# Patient Record
Sex: Male | Born: 1970 | Race: Black or African American | Hispanic: No | Marital: Married | Smoking: Never smoker
Health system: Southern US, Community
[De-identification: ages and names within clinical notes are randomized; demographics above are authoritative.]

## PROBLEM LIST (undated history)

## (undated) DIAGNOSIS — R918 Other nonspecific abnormal finding of lung field: Secondary | ICD-10-CM

## (undated) DIAGNOSIS — K219 Gastro-esophageal reflux disease without esophagitis: Secondary | ICD-10-CM

## (undated) DIAGNOSIS — N529 Male erectile dysfunction, unspecified: Secondary | ICD-10-CM

## (undated) DIAGNOSIS — G4700 Insomnia, unspecified: Secondary | ICD-10-CM

## (undated) DIAGNOSIS — E119 Type 2 diabetes mellitus without complications: Secondary | ICD-10-CM

## (undated) DIAGNOSIS — K760 Fatty (change of) liver, not elsewhere classified: Secondary | ICD-10-CM

## (undated) DIAGNOSIS — I10 Essential (primary) hypertension: Secondary | ICD-10-CM

## (undated) HISTORY — DX: Male erectile dysfunction, unspecified: N52.9

## (undated) HISTORY — DX: Fatty (change of) liver, not elsewhere classified: K76.0

## (undated) HISTORY — DX: Other nonspecific abnormal finding of lung field: R91.8

## (undated) HISTORY — DX: Gastro-esophageal reflux disease without esophagitis: K21.9

## (undated) HISTORY — DX: Insomnia, unspecified: G47.00

---

## 1997-12-03 ENCOUNTER — Emergency Department (HOSPITAL_COMMUNITY): Admission: EM | Admit: 1997-12-03 | Discharge: 1997-12-03 | Payer: Self-pay | Admitting: Emergency Medicine

## 2005-02-08 ENCOUNTER — Emergency Department (HOSPITAL_COMMUNITY): Admission: EM | Admit: 2005-02-08 | Discharge: 2005-02-08 | Payer: Self-pay | Admitting: Emergency Medicine

## 2007-07-14 ENCOUNTER — Emergency Department (HOSPITAL_COMMUNITY): Admission: EM | Admit: 2007-07-14 | Discharge: 2007-07-14 | Payer: Self-pay | Admitting: Emergency Medicine

## 2007-09-26 ENCOUNTER — Emergency Department (HOSPITAL_COMMUNITY): Admission: EM | Admit: 2007-09-26 | Discharge: 2007-09-26 | Payer: Self-pay | Admitting: Emergency Medicine

## 2008-04-29 ENCOUNTER — Emergency Department (HOSPITAL_COMMUNITY): Admission: EM | Admit: 2008-04-29 | Discharge: 2008-04-29 | Payer: Self-pay | Admitting: Emergency Medicine

## 2008-05-01 ENCOUNTER — Emergency Department (HOSPITAL_COMMUNITY): Admission: EM | Admit: 2008-05-01 | Discharge: 2008-05-01 | Payer: Self-pay | Admitting: Emergency Medicine

## 2008-07-25 ENCOUNTER — Emergency Department (HOSPITAL_COMMUNITY): Admission: EM | Admit: 2008-07-25 | Discharge: 2008-07-25 | Payer: Self-pay | Admitting: Emergency Medicine

## 2008-10-10 ENCOUNTER — Emergency Department (HOSPITAL_COMMUNITY): Admission: EM | Admit: 2008-10-10 | Discharge: 2008-10-10 | Payer: Self-pay | Admitting: Emergency Medicine

## 2008-11-13 ENCOUNTER — Ambulatory Visit: Payer: Self-pay | Admitting: Family Medicine

## 2008-11-13 DIAGNOSIS — I10 Essential (primary) hypertension: Secondary | ICD-10-CM | POA: Insufficient documentation

## 2008-11-13 DIAGNOSIS — K219 Gastro-esophageal reflux disease without esophagitis: Secondary | ICD-10-CM

## 2008-11-17 LAB — CONVERTED CEMR LAB
ALT: 36 units/L (ref 0–53)
BUN: 13 mg/dL (ref 6–23)
Basophils Relative: 0.2 % (ref 0.0–3.0)
CO2: 28 meq/L (ref 19–32)
Chloride: 108 meq/L (ref 96–112)
Cholesterol: 189 mg/dL (ref 0–200)
Eosinophils Relative: 0.7 % (ref 0.0–5.0)
Glucose, Bld: 99 mg/dL (ref 70–99)
HCT: 43.1 % (ref 39.0–52.0)
LDL Cholesterol: 127 mg/dL — ABNORMAL HIGH (ref 0–99)
Lymphs Abs: 2.5 10*3/uL (ref 0.7–4.0)
MCV: 93.4 fL (ref 78.0–100.0)
Monocytes Absolute: 0.6 10*3/uL (ref 0.1–1.0)
Platelets: 252 10*3/uL (ref 150.0–400.0)
Potassium: 4.1 meq/L (ref 3.5–5.1)
TSH: 1.4 microintl units/mL (ref 0.35–5.50)
Total Bilirubin: 1.2 mg/dL (ref 0.3–1.2)
Total Protein: 8.1 g/dL (ref 6.0–8.3)
WBC: 7.2 10*3/uL (ref 4.5–10.5)

## 2009-02-04 ENCOUNTER — Emergency Department (HOSPITAL_COMMUNITY): Admission: EM | Admit: 2009-02-04 | Discharge: 2009-02-04 | Payer: Self-pay | Admitting: Emergency Medicine

## 2009-03-19 ENCOUNTER — Ambulatory Visit: Payer: Self-pay | Admitting: Family Medicine

## 2009-03-19 DIAGNOSIS — G44219 Episodic tension-type headache, not intractable: Secondary | ICD-10-CM

## 2009-03-30 ENCOUNTER — Ambulatory Visit: Payer: Self-pay | Admitting: Family Medicine

## 2009-03-30 DIAGNOSIS — K921 Melena: Secondary | ICD-10-CM | POA: Insufficient documentation

## 2010-04-12 NOTE — Assessment & Plan Note (Signed)
Summary: H/A'S // RS   Vital Signs:  Patient profile:   40 year old male Temp:     98.8 degrees F oral BP sitting:   120 / 70  (left arm) Cuff size:   large  Vitals Entered By: Sid Falcon LPN (March 19, 2009 11:15 AM) CC: Headache X 1 week   History of Present Illness: Acute visit for headache.  Headache is bifrontal and dull achy quality. Mild intensity. No throbbing. Denies any nausea, vomiting, fever, visual disturbance or a focal neurologic concerns. Tried low-dose ibuprofen 200 mg and Tylenol without much relief. No history of migraines. No exacerbating features. In general gets poor sleep and has so for years. No significant caffeine use. Denies any new stressors.  Allergies (verified): No Known Drug Allergies  Past History:  Past Medical History: Last updated: 11/13/2008 Alcohol/Drug problem Blood in stool Frequent headaches GERD Hypertension  Review of Systems  The patient denies anorexia, fever, weight loss, and transient blindness.    Physical Exam  General:  Well-developed,well-nourished,in no acute distress; alert,appropriate and cooperative throughout examination Head:  Normocephalic and atraumatic without obvious abnormalities. No apparent alopecia or balding. Eyes:  No corneal or conjunctival inflammation noted. EOMI. Perrla. Funduscopic exam benign, without hemorrhages, exudates or papilledema. Vision grossly normal. Ears:  External ear exam shows no significant lesions or deformities.  Otoscopic examination reveals clear canals, tympanic membranes are intact bilaterally without bulging, retraction, inflammation or discharge. Hearing is grossly normal bilaterally. Mouth:  Oral mucosa and oropharynx without lesions or exudates.  Teeth in good repair. Neck:  No deformities, masses, or tenderness noted. Lungs:  Normal respiratory effort, chest expands symmetrically. Lungs are clear to auscultation, no crackles or wheezes. Heart:  Normal rate and regular  rhythm. S1 and S2 normal without gallop, murmur, click, rub or other extra sounds. Neurologic:  alert & oriented X3, cranial nerves II-XII intact, strength normal in all extremities, and gait normal.     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  Complete Medication List: 1)  No Meds  2)  Naproxen Sodium 550 Mg Tabs (Naproxen sodium) .... One by mouth two times a day with food as needed severe headache  Patient Instructions: 1)  Call or be in touch by next week if headache not improved. Prescriptions: NAPROXEN SODIUM 550 MG TABS (NAPROXEN SODIUM) one by mouth two times a day with food as needed severe headache  #30 x 0   Entered and Authorized by:   Evelena Peat MD   Signed by:   Evelena Peat MD on 03/19/2009   Method used:   Electronically to        [facility] (retail)       [address]       [place], [place]  [address]       Ph: [phone]       Fax: [phone]   RxID:   [id number]

## 2010-04-12 NOTE — Assessment & Plan Note (Signed)
Summary: blood in stool/for 2-3 month/cjr   Vital Signs:  Patient profile:   40 year old male Weight:      284 pounds Temp:     98.7 degrees F oral BP sitting:   120 / 80  (left arm) Cuff size:   large  Vitals Entered By: Sid Falcon LPN (March 30, 2009 11:48 AM) CC: Blood in stool, bright red 3 days ago   History of Present Illness: Acute visit. Bright red blood per rectum estimated 2 months. Symptoms very intermittent. Occasional minimal pain with stools but for the most part no associated pain. Denies any associated diarrhea. Appetite has been good. Weight gain and no weight loss. No history of known hemorrhoids. No associated abdominal pain.  Allergies (verified): No Known Drug Allergies  Past History:  Past Medical History: Last updated: 11/13/2008 Alcohol/Drug problem Blood in stool Frequent headaches GERD Hypertension  Review of Systems      See HPI  Physical Exam  General:  Well-developed,well-nourished,in no acute distress; alert,appropriate and cooperative throughout examination Abdomen:  soft, non-tender, and no masses.   Rectal:  patient has no external hemorrhoids. No visible anal fissure. By anoscopy, he has inflamed internal hemorrhoids but no active bleeding.  no palpable mass.   Impression & Recommendations:  Problem # 1:  HEMATOCHEZIA (ICD-578.1)  very likely related to internal hemorrhoids.  Measures to avoid constipation. Try Anusol hydrocortisone suppositories and follow up 2-3 weeks if any persistent symptoms.  Orders: Anoscopy (244[address])  Complete Medication List: 1)  No Meds  2)  Naproxen Sodium 550 Mg Tabs (Naproxen sodium) .... One by mouth two times a day with food as needed severe headache 3)  Anucort-hc 25 Mg Supp (Hydrocortisone acetate) .... One supp two times a day for 2 weeks  Patient Instructions: 1)  eat plenty of fiber and drink lots of fluids. 2)  Continue regular exercise. 3)  Start Anusol suppository one twice daily 4)   Consider stool softener such as Senokot-S or Colace 2 daily Prescriptions: ANUCORT-HC 25 MG SUPP (HYDROCORTISONE ACETATE) one supp two times a day for 2 weeks  #28 x 1   Entered and Authorized by:   Evelena Peat MD   Signed by:   Evelena Peat MD on 03/30/2009   Method used:   Print then Give to Patient   RxID:   [id number]

## 2010-06-28 LAB — POCT I-STAT, CHEM 8
HCT: 48 % (ref 39.0–52.0)
Hemoglobin: 16.3 g/dL (ref 13.0–17.0)
Sodium: 141 mEq/L (ref 135–145)
TCO2: 25 mmol/L (ref 0–100)

## 2012-01-12 ENCOUNTER — Emergency Department (HOSPITAL_COMMUNITY)
Admission: EM | Admit: 2012-01-12 | Discharge: 2012-01-12 | Disposition: A | Discharge status: Home / Self Care | Payer: Self-pay | Attending: Emergency Medicine | Admitting: Emergency Medicine

## 2012-01-12 ENCOUNTER — Encounter (HOSPITAL_COMMUNITY): Payer: Self-pay | Admitting: Emergency Medicine

## 2012-01-12 DIAGNOSIS — R109 Unspecified abdominal pain: Secondary | ICD-10-CM

## 2012-01-12 DIAGNOSIS — K625 Hemorrhage of anus and rectum: Secondary | ICD-10-CM | POA: Insufficient documentation

## 2012-01-12 DIAGNOSIS — K921 Melena: Secondary | ICD-10-CM | POA: Insufficient documentation

## 2012-01-12 DIAGNOSIS — K649 Unspecified hemorrhoids: Secondary | ICD-10-CM | POA: Insufficient documentation

## 2012-01-12 LAB — CBC WITH DIFFERENTIAL/PLATELET
Lymphocytes Relative: 44 % (ref 12–46)
Lymphs Abs: 3.5 10*3/uL (ref 0.7–4.0)
Neutrophils Relative %: 46 % (ref 43–77)
Platelets: 220 10*3/uL (ref 150–400)
RBC: 4.55 MIL/uL (ref 4.22–5.81)
WBC: 8 10*3/uL (ref 4.0–10.5)

## 2012-01-12 NOTE — ED Notes (Signed)
Pt states he has been having some intermittent abd pain for the past several weeks and for the past week he has noticed some blood in his stools  Pt states it feels as if he has inflammation in his stomach  Denies N/V

## 2012-01-12 NOTE — ED Provider Notes (Signed)
History     CSN: [id number]  Arrival date & time 01/12/12  0423   First MD Initiated Contact with Patient 01/12/12 [phone]      Chief Complaint  Patient presents with  . Abdominal Pain  . Rectal Bleeding    (Consider location/radiation/quality/duration/timing/severity/associated sxs/prior treatment) HPI Comments: The patient is a 41 year old otherwise healthy male who states that over the last couple of months he has had gradual onset of intermittent blood in his stools and over the last 2 weeks has had persistent mild left lower quadrant abdominal pain. He states that this pain is mild, intermittent, not associated with nausea vomiting or change in bowel habits as far as diarrhea or constipation. He has normal soft stools, the blood that he sees in his stools it is bright red, seems to be on the surface of the stools and is not mixed in with it. He has no epigastric pain, no history of aspirin use, no history of over-the-counter medication use including nonsteroidal anti-inflammatory drugs. He denies use of prednisone or any history of stomach ulcers. He does not have a history of abdominal surgery.  Patient is a 41 y.o. male presenting with abdominal pain and hematochezia. The history is provided by the patient.  Abdominal Pain The primary symptoms of the illness include abdominal pain and hematochezia. The primary symptoms of the illness do not include nausea, vomiting or diarrhea.  Rectal Bleeding  Associated symptoms include abdominal pain. Pertinent negatives include no diarrhea, no nausea and no vomiting.    History reviewed. No pertinent past medical history.  History reviewed. No pertinent past surgical history.  Family History  Problem Relation Age of Onset  . Diabetes Other   . Hypertension Other     History  Substance Use Topics  . Smoking status: Never Smoker   . Smokeless tobacco: Not on file  . Alcohol Use: Yes      Review of Systems  Gastrointestinal:  Positive for abdominal pain and hematochezia. Negative for nausea, vomiting and diarrhea.  All other systems reviewed and are negative.    Allergies  Review of patient's allergies indicates no known allergies.  Home Medications  No current outpatient prescriptions on file.  BP 153/88  Pulse 92  Temp 97.8 F (36.6 C) (Oral)  Resp 20  SpO2 98%  Physical Exam  Nursing note and vitals reviewed. Constitutional: He appears well-developed and well-nourished. No distress.  HENT:  Head: Normocephalic and atraumatic.  Mouth/Throat: Oropharynx is clear and moist. No oropharyngeal exudate.  Eyes: Conjunctivae normal and EOM are normal. Pupils are equal, round, and reactive to light. Right eye exhibits no discharge. Left eye exhibits no discharge. No scleral icterus.  Neck: Normal range of motion. Neck supple. No JVD present. No thyromegaly present.  Cardiovascular: Normal rate, regular rhythm, normal heart sounds and intact distal pulses.  Exam reveals no gallop and no friction rub.   No murmur heard. Pulmonary/Chest: Effort normal and breath sounds normal. No respiratory distress. He has no wheezes. He has no rales.  Abdominal: Soft. Bowel sounds are normal. He exhibits no distension and no mass. There is tenderness ( Minimal left lower quadrant tenderness, no guarding, no peritoneal signs, no masses, very soft, no pain at the right lower quadrant or right upper quadrant).  Genitourinary:       No external hemorrhoids, one small internal hemorrhoid palpated, no prostate tenderness  Musculoskeletal: Normal range of motion. He exhibits no edema and no tenderness.  Lymphadenopathy:    He has  no cervical adenopathy.  Neurological: He is alert. Coordination normal.  Skin: Skin is warm and dry. No rash noted. No erythema.  Psychiatric: He has a normal mood and affect. His behavior is normal.    ED Course  Procedures (including critical care time)   Labs Reviewed  CBC WITH DIFFERENTIAL    No results found.   1. Rectal bleeding   2. Hemorrhoid   3. Abdominal pain       MDM  Overall patient is well-appearing, has mild hypertension but otherwise normal vital signs, Hemoccult stool card sample that I collected is faintly positive in one very small area for occult blood though there was no stool in the rectal vault, small amount of mucus but no gross blood. Will check CBC, patient can be referred as outpatient for further workup of occult GI bleeding   CBC normal, patient referred back to her family Dr. for further workup as needed.     Vida Roller, MD 01/12/12 [phone]

## 2012-01-15 ENCOUNTER — Ambulatory Visit (INDEPENDENT_AMBULATORY_CARE_PROVIDER_SITE_OTHER): Payer: Self-pay | Admitting: Family Medicine

## 2012-01-15 ENCOUNTER — Encounter: Payer: Self-pay | Admitting: Family Medicine

## 2012-01-15 VITALS — BP 140/84 | Temp 97.9°F | Wt 290.0 lb

## 2012-01-15 DIAGNOSIS — K921 Melena: Secondary | ICD-10-CM

## 2012-01-15 NOTE — Patient Instructions (Addendum)
Call me and I will set up referral for colonoscopy screening.

## 2012-01-15 NOTE — Progress Notes (Signed)
  Subjective:    Patient ID: Peter Mcclure, male    DOB: 20-Jul-1970, 41 y.o.   MRN: [id number]  HPI  Patient seen for ER followup. He's had some intermittent bright red blood per rectum for the past couple months. He was prompted to go to ER over the weekend because of some mild left lower quadrant discomfort. No recent change in stools. He has bright blood intermittently which he describes as being on surface of stools. No recent change in stool habits in terms of any constipation or diarrhea. No appetite or weight changes. CBC unremarkable. No anemia. In emergency room, patient had no significant stool in rectal vault and faint heme positive. Has never had screening colonoscopy. He had an anoscopy over 2 years ago which showed inflamed internal hemorrhoid otherwise unremarkable. No family history of colon cancer  Review of Systems  Constitutional: Negative for chills, appetite change and unexpected weight change.  Respiratory: Negative for shortness of breath.   Cardiovascular: Negative for chest pain.  Gastrointestinal: Negative for nausea, vomiting, diarrhea, constipation and abdominal distention.  Genitourinary: Negative for dysuria.  Neurological: Negative for dizziness.       Objective:   Physical Exam  Constitutional: He appears well-developed and well-nourished.  Cardiovascular: Normal rate and regular rhythm.   Pulmonary/Chest: Effort normal and breath sounds normal. No respiratory distress. He has no wheezes. He has no rales.  Abdominal: Soft. Bowel sounds are normal. He exhibits no distension and no mass. There is no tenderness. There is no rebound and no guarding.          Assessment & Plan:  Recurrent hematochezia. Patient does not have any worrisome symptoms such as weight loss, appetite change, or anemia. Even though this likely represents benign etiology such as internal hemorrhoids he's had recurrent bleeding intermittently for several months and needs further evaluation.  Will recommend GI referral. He is currently trying to get back on insurance coverage and will call me back later this week when he is ready to proceed.

## 2012-12-04 ENCOUNTER — Emergency Department (HOSPITAL_COMMUNITY)
Admission: EM | Admit: 2012-12-04 | Discharge: 2012-12-04 | Disposition: A | Discharge status: Home / Self Care | Payer: Self-pay | Attending: Emergency Medicine | Admitting: Emergency Medicine

## 2012-12-04 ENCOUNTER — Encounter (HOSPITAL_COMMUNITY): Payer: Self-pay | Admitting: Emergency Medicine

## 2012-12-04 DIAGNOSIS — R110 Nausea: Secondary | ICD-10-CM | POA: Insufficient documentation

## 2012-12-04 DIAGNOSIS — R51 Headache: Secondary | ICD-10-CM | POA: Insufficient documentation

## 2012-12-04 DIAGNOSIS — R000 Tachycardia, unspecified: Secondary | ICD-10-CM | POA: Insufficient documentation

## 2012-12-04 DIAGNOSIS — I10 Essential (primary) hypertension: Secondary | ICD-10-CM | POA: Insufficient documentation

## 2012-12-04 HISTORY — DX: Essential (primary) hypertension: I10

## 2012-12-04 NOTE — Progress Notes (Signed)
[facility] provided pt with a list of primary care resources. Patient stated that he is pending insurance through job.  °

## 2012-12-04 NOTE — ED Provider Notes (Signed)
CSN: [id number]     Arrival date & time 12/04/12  1401 History   First MD Initiated Contact with Patient 12/04/12 1506     Chief Complaint  Patient presents with  . hypertention   . Headache   (Consider location/radiation/quality/duration/timing/severity/associated sxs/prior Treatment) HPI Comments: 42 year old male with a past medical history of hypertension presents to the emergency department complaining of headaches and nausea intermittently for the past 2-3 weeks. Nothing in specific makes the headaches come and go, they come throughout the day and he is unsure how long they last. Headaches are generalized rated 3-4/10. He has tried taking Tylenol with mild relief. Admits to associated nausea without vomiting. Denies blurred vision, double vision, chest pain, shortness of breath, confusion, dizziness or lightheadedness. States he has a history of high blood pressure, however has never needed medication for this. He has not seen a primary care physician in a few years. Admits to eating a lot of canned food and does not exercise regularly.  Patient is a 42 y.o. male presenting with headaches. The history is provided by the patient.  Headache Associated symptoms: nausea   Associated symptoms: no back pain, no dizziness, no fever, no neck pain, no neck stiffness and no vomiting     Past Medical History  Diagnosis Date  . Hypertension    History reviewed. No pertinent past surgical history. Family History  Problem Relation Age of Onset  . Diabetes Other   . Hypertension Other    History  Substance Use Topics  . Smoking status: Never Smoker   . Smokeless tobacco: Not on file  . Alcohol Use: Yes    Review of Systems  Constitutional: Negative for fever, chills and activity change.  HENT: Negative for neck pain and neck stiffness.   Eyes: Negative for visual disturbance.  Respiratory: Negative for shortness of breath.   Cardiovascular: Negative for chest pain.  Gastrointestinal:  Positive for nausea. Negative for vomiting.  Musculoskeletal: Negative for back pain.  Skin: Negative for rash.  Neurological: Positive for headaches. Negative for dizziness, weakness and light-headedness.  Psychiatric/Behavioral: Negative for confusion.    Allergies  Review of patient's allergies indicates no known allergies.  Home Medications   Current Outpatient Rx  Name  Route  Sig  Dispense  Refill  . acetaminophen (TYLENOL) 500 MG tablet   Oral   Take 500 mg by mouth every 6 (six) hours as needed for pain.         . fish oil-omega-3 fatty acids 1000 MG capsule   Oral   Take 1 g by mouth daily.          BP 155/100  Pulse 115  Temp(Src) 98.8 F (37.1 C) (Oral)  Resp 21  SpO2 98% Physical Exam  Nursing note and vitals reviewed. Constitutional: He is oriented to person, place, and time. He appears well-developed and well-nourished. No distress.  HENT:  Head: Normocephalic and atraumatic.  Mouth/Throat: Oropharynx is clear and moist.  Eyes: Conjunctivae and EOM are normal. Pupils are equal, round, and reactive to light.  Neck: Normal range of motion. Neck supple.  Cardiovascular: Regular rhythm, normal heart sounds, intact distal pulses and normal pulses.  Tachycardia present.   Pulmonary/Chest: Effort normal and breath sounds normal.  Abdominal: Soft. Bowel sounds are normal. He exhibits no distension. There is no tenderness.  Musculoskeletal: Normal range of motion. He exhibits no edema.  Neurological: He is alert and oriented to person, place, and time. He has normal strength. No cranial nerve  deficit or sensory deficit. Coordination and gait normal.  Skin: Skin is warm and dry. He is not diaphoretic.  Psychiatric: He has a normal mood and affect. His behavior is normal.    ED Course  Procedures (including critical care time) Labs Review Labs Reviewed - No data to display Imaging Review No results found.  MDM   1. Headache   2. Hypertension    Patient  with headache and nausea intermittently for 2 weeks. No red flags concerning patient's headache. He is afebrile and in no apparent distress, unremarkable neurologic exam. No meningeal signs. Blood pressure 155/100 in the emergency department. Patient admits to a high salt diet, no exercise. He has not seen a primary care physician in a few years. Resources given for primary care physician followup, discussed lifestyle changes. Return precautions discussed. Patient states understanding of plan and is agreeable.    Trevor Mace, PA-C 12/04/12 1535

## 2012-12-04 NOTE — ED Provider Notes (Signed)
Medical screening examination/treatment/procedure(s) were performed by non-physician practitioner and as supervising physician I was immediately available for consultation/collaboration.  Flint Melter, MD 12/04/12 1745

## 2012-12-04 NOTE — ED Notes (Signed)
Per pt, headaches/nausea on and off for months-thinks it is related to high blood pressure

## 2013-01-16 ENCOUNTER — Ambulatory Visit: Payer: Self-pay | Admitting: Family Medicine

## 2013-01-16 ENCOUNTER — Encounter (HOSPITAL_COMMUNITY): Payer: Self-pay | Admitting: Emergency Medicine

## 2013-01-16 ENCOUNTER — Emergency Department (HOSPITAL_COMMUNITY)
Admission: EM | Admit: 2013-01-16 | Discharge: 2013-01-16 | Disposition: A | Discharge status: Home / Self Care | Payer: No Typology Code available for payment source | Attending: Emergency Medicine | Admitting: Emergency Medicine

## 2013-01-16 ENCOUNTER — Emergency Department (HOSPITAL_COMMUNITY): Payer: No Typology Code available for payment source

## 2013-01-16 ENCOUNTER — Telehealth: Payer: Self-pay | Admitting: Family Medicine

## 2013-01-16 DIAGNOSIS — R42 Dizziness and giddiness: Secondary | ICD-10-CM

## 2013-01-16 DIAGNOSIS — R5381 Other malaise: Secondary | ICD-10-CM | POA: Insufficient documentation

## 2013-01-16 DIAGNOSIS — I10 Essential (primary) hypertension: Secondary | ICD-10-CM | POA: Insufficient documentation

## 2013-01-16 LAB — CBC WITH DIFFERENTIAL/PLATELET
Eosinophils Relative: 1 % (ref 0–5)
HCT: 40.5 % (ref 39.0–52.0)
Lymphocytes Relative: 31 % (ref 12–46)
Lymphs Abs: 2.4 10*3/uL (ref 0.7–4.0)
MCV: 90.4 fL (ref 78.0–100.0)
Monocytes Absolute: 0.4 10*3/uL (ref 0.1–1.0)
Neutro Abs: 4.7 10*3/uL (ref 1.7–7.7)
Platelets: 261 10*3/uL (ref 150–400)
RBC: 4.48 MIL/uL (ref 4.22–5.81)
WBC: 7.6 10*3/uL (ref 4.0–10.5)

## 2013-01-16 LAB — COMPREHENSIVE METABOLIC PANEL
ALT: 28 U/L (ref 0–53)
Alkaline Phosphatase: 68 U/L (ref 39–117)
CO2: 21 mEq/L (ref 19–32)
Calcium: 9.8 mg/dL (ref 8.4–10.5)
Chloride: 100 mEq/L (ref 96–112)
GFR calc Af Amer: >90 mL/min (ref >90)
GFR calc non Af Amer: >90 mL/min (ref >90)
Glucose, Bld: 212 mg/dL — ABNORMAL HIGH (ref 70–99)
Sodium: 134 mEq/L — ABNORMAL LOW (ref 135–145)
Total Bilirubin: 0.4 mg/dL (ref 0.3–1.2)

## 2013-01-16 NOTE — Telephone Encounter (Signed)
Noted  

## 2013-01-16 NOTE — Telephone Encounter (Signed)
Patient Information:  Caller Name: Johngabriel  Phone: [phone]  Patient: Peter Mcclure, Peter Mcclure  Gender: Male  DOB: [birth year]  Age: 42 Years  PCP: Evelena Peat [facility])  Office Follow Up:  Does the office need to follow up with this patient?: No  Instructions For The Office: N/A  RN Note:  Pt already has appointment scheduled with Dr. Caryl Never for 01/20/2013.  Symptoms  Reason For Call & Symptoms: Onset of nausea 1 month ago and seen in ED, dxed with HTN.  Nausea returned 2 days ago.  Also c/o headache and some left mid to lower abdominal discomfort.  Reviewed Health History In EMR: Yes  Reviewed Medications In EMR: Yes  Reviewed Allergies In EMR: Yes  Reviewed Surgeries / Procedures: Yes  Date of Onset of Symptoms: 01/14/2013  Guideline(s) Used:  Vomiting  Disposition Per Guideline:   Home Care  Reason For Disposition Reached:   Vomiting  Advice Given:  Reassurance:  Vomiting can be caused by many types of illnesses. It can be caused by a stomach flu virus. It can be caused by eating or drinking something that disagreed with your stomach.  Adults with vomiting need to stay hydrated. This is the most important thing. If you don't drink and replace lost fluids, you may get dehydrated.  You can treat vomiting, even if there is mild dehydration, at home.  Here is some care advice that should help.  Clear Liquids:  Sip water or a rehydration drink (e.g., Gatorade or Powerade).  Other options: 1/2 strength flat lemon-lime soda or ginger ale.  After 4 hours without vomiting, increase the amount.  Avoid Nonprescription Medicines:  Stop all nonprescription medicines for 24 hours (Reason: they may make vomiting worse).  Call if vomiting a prescription medicine.  Contagiousness:  You can return to work or school after vomiting and fever are gone.  Call Back If:  Vomiting lasts for more than 2 days (48 hours)  Signs of dehydration occur  You become worse.  Patient Will  Follow Care Advice:  YES

## 2013-01-16 NOTE — ED Notes (Signed)
Pt from home c/o lightheadedness and dizziness x 2 days accompanied by a headache in the mornings. No nausea, vomiting, or blurred vision. He has an appt with his PCP on Monday.

## 2013-01-16 NOTE — ED Provider Notes (Signed)
CSN: [id number]     Arrival date & time 01/16/13  1513 History   First MD Initiated Contact with Patient 01/16/13 1522     Chief Complaint  Patient presents with  . Dizziness   (Consider location/radiation/quality/duration/timing/severity/associated sxs/prior Treatment) Patient is a 42 y.o. male presenting with weakness. The history is provided by the patient (the pt complains of dizziness).  Weakness This is a new problem. The current episode started more than 1 week ago. The problem occurs daily. The problem has not changed since onset.Pertinent negatives include no chest pain, no abdominal pain and no headaches. Nothing aggravates the symptoms. Nothing relieves the symptoms.    Past Medical History  Diagnosis Date  . Hypertension    History reviewed. No pertinent past surgical history. Family History  Problem Relation Age of Onset  . Diabetes Other   . Hypertension Other    History  Substance Use Topics  . Smoking status: Never Smoker   . Smokeless tobacco: Not on file  . Alcohol Use: 2.4 oz/week    4 Cans of beer per week     Comment: drinks daily    Review of Systems  Constitutional: Negative for appetite change and fatigue.  HENT: Negative for congestion, ear discharge and sinus pressure.   Eyes: Negative for discharge.  Respiratory: Negative for cough.   Cardiovascular: Negative for chest pain.  Gastrointestinal: Negative for abdominal pain and diarrhea.  Genitourinary: Negative for frequency and hematuria.  Musculoskeletal: Negative for back pain.  Skin: Negative for rash.  Neurological: Positive for weakness. Negative for seizures and headaches.  Psychiatric/Behavioral: Negative for hallucinations.    Allergies  Review of patient's allergies indicates no known allergies.  Home Medications   Current Outpatient Rx  Name  Route  Sig  Dispense  Refill  . acetaminophen (TYLENOL) 500 MG tablet   Oral   Take 500 mg by mouth every 6 (six) hours as needed for  pain.         . fish oil-omega-3 fatty acids 1000 MG capsule   Oral   Take 1 g by mouth daily.          BP 120/74  Pulse 118  Temp(Src) 98.4 F (36.9 C) (Oral)  Ht 6\' 4"  (1.93 m)  Wt 275 lb (124.739 kg)  BMI 33.49 kg/m2  SpO2 100% Physical Exam  Constitutional: He is oriented to person, place, and time. He appears well-developed.  HENT:  Head: Normocephalic.  Eyes: Conjunctivae and EOM are normal. No scleral icterus.  Neck: Neck supple. No thyromegaly present.  Cardiovascular: Normal rate and regular rhythm.  Exam reveals no gallop and no friction rub.   No murmur heard. Pulmonary/Chest: No stridor. He has no wheezes. He has no rales. He exhibits no tenderness.  Abdominal: He exhibits no distension. There is no tenderness. There is no rebound.  Musculoskeletal: Normal range of motion. He exhibits no edema.  Lymphadenopathy:    He has no cervical adenopathy.  Neurological: He is oriented to person, place, and time. He exhibits normal muscle tone. Coordination normal.  Skin: No rash noted. No erythema.  Psychiatric: He has a normal mood and affect. His behavior is normal.    ED Course  Procedures (including critical care time) Labs Review Labs Reviewed  COMPREHENSIVE METABOLIC PANEL - Abnormal; Notable for the following:    Sodium 134 (*)    Glucose, Bld 212 (*)    All other components within normal limits  CBC WITH DIFFERENTIAL   Imaging Review Ct  Head Wo Contrast  01/16/2013   CLINICAL DATA:  Hypertension  EXAM: CT HEAD WITHOUT CONTRAST  TECHNIQUE: Contiguous axial images were obtained from the base of the skull through the vertex without intravenous contrast.  COMPARISON:  05/01/2008  FINDINGS: No skull fracture is noted. No intracranial hemorrhage, mass effect or midline shift. There is mucosal thickening left maxillary sinus. Mastoid air cells are unremarkable.  No acute infarction. No mass lesion is noted on this unenhanced scan. Ventricular size is stable from  prior exam. No intra or extra-axial fluid collection.  IMPRESSION: No acute intracranial abnormality. Mucosal thickening left maxillary sinus.   Electronically Signed   By: Natasha Mead M.D.   On: 01/16/2013 16:13    EKG Interpretation     Ventricular Rate:  100 PR Interval:  174 QRS Duration: 100 QT Interval:  332 QTC Calculation: 428 R Axis:   67 Text Interpretation:  Sinus tachycardia Left atrial enlargement RSR' in V1 or V2, right VCD or RVH            MDM   1. Dizziness         Benny Lennert, MD 01/16/13 [phone]

## 2013-01-20 ENCOUNTER — Encounter: Payer: Self-pay | Admitting: Family Medicine

## 2013-01-20 ENCOUNTER — Ambulatory Visit (INDEPENDENT_AMBULATORY_CARE_PROVIDER_SITE_OTHER): Payer: No Typology Code available for payment source | Admitting: Family Medicine

## 2013-01-20 VITALS — BP 130/78 | HR 97 | Temp 97.6°F | Wt 275.0 lb

## 2013-01-20 DIAGNOSIS — E1165 Type 2 diabetes mellitus with hyperglycemia: Secondary | ICD-10-CM | POA: Insufficient documentation

## 2013-01-20 DIAGNOSIS — R202 Paresthesia of skin: Secondary | ICD-10-CM

## 2013-01-20 DIAGNOSIS — R739 Hyperglycemia, unspecified: Secondary | ICD-10-CM

## 2013-01-20 DIAGNOSIS — E669 Obesity, unspecified: Secondary | ICD-10-CM | POA: Insufficient documentation

## 2013-01-20 DIAGNOSIS — R7309 Other abnormal glucose: Secondary | ICD-10-CM

## 2013-01-20 DIAGNOSIS — R209 Unspecified disturbances of skin sensation: Secondary | ICD-10-CM

## 2013-01-20 DIAGNOSIS — Z23 Encounter for immunization: Secondary | ICD-10-CM

## 2013-01-20 LAB — GLUCOSE, POCT (MANUAL RESULT ENTRY): POC Glucose: 171 mg/dl — AB (ref 70–99)

## 2013-01-20 MED ORDER — METFORMIN HCL 500 MG PO TABS: 500.0000 mg | ORAL_TABLET | Freq: Two times a day (BID) | ORAL | Status: DC

## 2013-01-20 NOTE — Progress Notes (Signed)
Pre visit review using our clinic review tool, if applicable. No additional management support is needed unless otherwise documented below in the visit note. 

## 2013-01-20 NOTE — Addendum Note (Signed)
Addended by: Shelby Dubin E on: 01/20/2013 10:40 AM   Modules accepted: Orders

## 2013-01-20 NOTE — Patient Instructions (Signed)
Type 2 Diabetes Mellitus, Adult Type 2 diabetes mellitus, often simply referred to as type 2 diabetes, is a long-lasting (chronic) disease. In type 2 diabetes, the pancreas does not make enough insulin (a hormone), the cells are less responsive to the insulin that is made (insulin resistance), or both. Normally, insulin moves sugars from food into the tissue cells. The tissue cells use the sugars for energy. The lack of insulin or the lack of normal response to insulin causes excess sugars to build up in the blood instead of going into the tissue cells. As a result, high blood sugar (hyperglycemia) develops. The effect of high sugar (glucose) levels can cause many complications. Type 2 diabetes was also previously called adult-onset diabetes but it can occur at any age.  RISK FACTORS  A person is predisposed to developing type 2 diabetes if someone in the family has the disease and also has one or more of the following primary risk factors:  Overweight.  An inactive lifestyle.  A history of consistently eating high-calorie foods. Maintaining a normal weight and regular physical activity can reduce the chance of developing type 2 diabetes. SYMPTOMS  A person with type 2 diabetes may not show symptoms initially. The symptoms of type 2 diabetes appear slowly. The symptoms include:  Increased thirst (polydipsia).  Increased urination (polyuria).  Increased urination during the night (nocturia).  Weight loss. This weight loss may be rapid.  Frequent, recurring infections.  Tiredness (fatigue).  Weakness.  Vision changes, such as blurred vision.  Fruity smell to your breath.  Abdominal pain.  Nausea or vomiting.  Cuts or bruises which are slow to heal.  Tingling or numbness in the hands or feet. DIAGNOSIS Type 2 diabetes is frequently not diagnosed until complications of diabetes are present. Type 2 diabetes is diagnosed when symptoms or complications are present and when blood  glucose levels are increased. Your blood glucose level may be checked by one or more of the following blood tests:  A fasting blood glucose test. You will not be allowed to eat for at least 8 hours before a blood sample is taken.  A random blood glucose test. Your blood glucose is checked at any time of the day regardless of when you ate.  A hemoglobin A1c blood glucose test. A hemoglobin A1c test provides information about blood glucose control over the previous 3 months.  An oral glucose tolerance test (OGTT). Your blood glucose is measured after you have not eaten (fasted) for 2 hours and then after you drink a glucose-containing beverage. TREATMENT   You may need to take insulin or diabetes medicine daily to keep blood glucose levels in the desired range.  You will need to match insulin dosing with exercise and healthy food choices. The treatment goal is to maintain the before meal blood sugar (preprandial glucose) level at 70 130 mg/dL. HOME CARE INSTRUCTIONS   Have your hemoglobin A1c level checked twice a year.  Perform daily blood glucose monitoring as directed by your caregiver.  Monitor urine ketones when you are ill and as directed by your caregiver.  Take your diabetes medicine or insulin as directed by your caregiver to maintain your blood glucose levels in the desired range.  Never run out of diabetes medicine or insulin. It is needed every day.  Adjust insulin based on your intake of carbohydrates. Carbohydrates can raise blood glucose levels but need to be included in your diet. Carbohydrates provide vitamins, minerals, and fiber which are an essential part of   a healthy diet. Carbohydrates are found in fruits, vegetables, whole grains, dairy products, legumes, and foods containing added sugars.    Eat healthy foods. Alternate 3 meals with 3 snacks.  Lose weight if overweight.  Carry a medical alert card or wear your medical alert jewelry.  Carry a 15 gram  carbohydrate snack with you at all times to treat low blood glucose (hypoglycemia). Some examples of 15 gram carbohydrate snacks include:  Glucose tablets, 3 or 4   Glucose gel, 15 gram tube  Raisins, 2 tablespoons (24 grams)  Jelly beans, 6  Animal crackers, 8  Regular pop, 4 ounces (120 mL)  Gummy treats, 9  Recognize hypoglycemia. Hypoglycemia occurs with blood glucose levels of 70 mg/dL and below. The risk for hypoglycemia increases when fasting or skipping meals, during or after intense exercise, and during sleep. Hypoglycemia symptoms can include:  Tremors or shakes.  Decreased ability to concentrate.  Sweating.  Increased heart rate.  Headache.  Dry mouth.  Hunger.  Irritability.  Anxiety.  Restless sleep.  Altered speech or coordination.  Confusion.  Treat hypoglycemia promptly. If you are alert and able to safely swallow, follow the 15:15 rule:  Take 15 20 grams of rapid-acting glucose or carbohydrate. Rapid-acting options include glucose gel, glucose tablets, or 4 ounces (120 mL) of fruit juice, regular soda, or low fat milk.  Check your blood glucose level 15 minutes after taking the glucose.  Take 15 20 grams more of glucose if the repeat blood glucose level is still 70 mg/dL or below.  Eat a meal or snack within 1 hour once blood glucose levels return to normal.    Be alert to polyuria and polydipsia which are early signs of hyperglycemia. An early awareness of hyperglycemia allows for prompt treatment. Treat hyperglycemia as directed by your caregiver.  Engage in at least 150 minutes of moderate-intensity physical activity a week, spread over at least 3 days of the week or as directed by your caregiver. In addition, you should engage in resistance exercise at least 2 times a week or as directed by your caregiver.  Adjust your medicine and food intake as needed if you start a new exercise or sport.  Follow your sick day plan at any time you  are unable to eat or drink as usual.  Avoid tobacco use.  Limit alcohol intake to no more than 1 drink per day for nonpregnant women and 2 drinks per day for men. You should drink alcohol only when you are also eating food. Talk with your caregiver whether alcohol is safe for you. Tell your caregiver if you drink alcohol several times a week.  Follow up with your caregiver regularly.  Schedule an eye exam soon after the diagnosis of type 2 diabetes and then annually.  Perform daily skin and foot care. Examine your skin and feet daily for cuts, bruises, redness, nail problems, bleeding, blisters, or sores. A foot exam by a caregiver should be done annually.  Brush your teeth and gums at least twice a day and floss at least once a day. Follow up with your dentist regularly.  Share your diabetes management plan with your workplace or school.  Stay up-to-date with immunizations.  Learn to manage stress.  Obtain ongoing diabetes education and support as needed.  Participate in, or seek rehabilitation as needed to maintain or improve independence and quality of life. Request a physical or occupational therapy referral if you are having foot or hand numbness or difficulties with grooming,   dressing, eating, or physical activity. SEEK MEDICAL CARE IF:   You are unable to eat food or drink fluids for more than 6 hours.  You have nausea and vomiting for more than 6 hours.  Your blood glucose level is over 240 mg/dL.  There is a change in mental status.  You develop an additional serious illness.  You have diarrhea for more than 6 hours.  You have been sick or have had a fever for a couple of days and are not getting better.  You have pain during any physical activity.  SEEK IMMEDIATE MEDICAL CARE IF:  You have difficulty breathing.  You have moderate to large ketone levels. MAKE SURE YOU:  Understand these instructions.  Will watch your condition.  Will get help right away if  you are not doing well or get worse. Document Released: 02/27/2005 Document Revised: 11/22/2011 Document Reviewed: 09/26/2011 [employer] Patient Information 2014 [employer].  

## 2013-01-20 NOTE — Progress Notes (Signed)
  Subjective:    Patient ID: Peter Mcclure, male    DOB: [birth year], 42 y.o.   MRN: [id number]  HPI Patient was recently seen in emergency department with some nonspecific dizziness. He had lab work including CBC and compressive metabolic panel. Blood sugars nonfasting was 212. Other labs were basically unremarkable. His dizziness has essentially resolved. He has extremely strong family history of type 2 diabetes. Patient never had known elevated blood sugars. No symptoms of polyuria or polydipsia.  He is seen with 3-4 month history of some numbness intermittently involving right great toe. No associated pain. No low back pain. Numbness is confined to the distal tip of the right great toe. No other extremity involvement.  Past Medical History  Diagnosis Date  . Hypertension    No past surgical history on file.  reports that he has never smoked. He does not have any smokeless tobacco history on file. He reports that he drinks about 2.4 ounces of alcohol per week. He reports that he does not use illicit drugs. family history includes Diabetes in his other; Hypertension in his other. No Known Allergies    Review of Systems  Constitutional: Negative for fever, chills, appetite change and unexpected weight change.  Respiratory: Negative for cough and shortness of breath.   Cardiovascular: Negative for chest pain.  Gastrointestinal: Negative for abdominal pain.  Endocrine: Negative for polydipsia and polyuria.  Neurological: Positive for numbness. Negative for weakness.       Objective:   Physical Exam  Constitutional: He appears well-developed and well-nourished.  Neck: Neck supple. No thyromegaly present.  Cardiovascular: Normal rate and regular rhythm.   Pulmonary/Chest: Effort normal and breath sounds normal. No respiratory distress. He has no wheezes. He has no rales.  Musculoskeletal: He exhibits no edema.          Assessment & Plan:  #1 probable type 2 diabetes. He has had  random blood sugar recently over 200 and fasting blood sugar today is 171. We discussed weight loss and exercise at some length. Start metformin 500 mg twice a day. Get a baseline hemoglobin A1c. Reassess in 3-4 weeks Home blood sugar monitor given and bring home readings at next visit #2 paresthesia involving right great toe. Question peripheral nerve compression. No evidence for lumbar radiculopathy. Check TSH and B12. Control diabetes as above.

## 2013-02-17 ENCOUNTER — Encounter: Payer: Self-pay | Admitting: Family Medicine

## 2013-02-17 ENCOUNTER — Ambulatory Visit (INDEPENDENT_AMBULATORY_CARE_PROVIDER_SITE_OTHER): Payer: No Typology Code available for payment source | Admitting: Family Medicine

## 2013-02-17 VITALS — BP 140/78 | HR 98 | Temp 97.9°F | Wt 272.0 lb

## 2013-02-17 DIAGNOSIS — E119 Type 2 diabetes mellitus without complications: Secondary | ICD-10-CM

## 2013-02-17 LAB — HM DIABETES EYE EXAM

## 2013-02-17 LAB — HM DIABETES FOOT EXAM: HM Diabetic Foot Exam: NORMAL

## 2013-02-17 NOTE — Progress Notes (Signed)
Pre visit review using our clinic review tool, if applicable. No additional management support is needed unless otherwise documented below in the visit note. 

## 2013-02-17 NOTE — Patient Instructions (Signed)
Take your metformin regularly-and try not to skip any. Try to lose some weight. Reduce refined sugars and white starches. Try Zantac or Pepcid for your stomach issues.

## 2013-02-17 NOTE — Progress Notes (Signed)
   Subjective:    Patient ID: Peter Mcclure, male    DOB: 1970/08/23, 42 y.o.   MRN: [id number]  HPI  Followup regarding type 2 diabetes Patient recently just diagnosed. A1c 7.3% with fasting blood sugar 171. He had confirmation of elevated blood sugar on more than one occasion. We started metformin 500 mg twice daily but is not taking this consistently. He is still drinking some high fructose corn syrup drinks and poor dietary compliance. We provided home glucose monitor with test strips but is not checking his blood sugars any. No symptoms of polydipsia or polyuria  Past Medical History  Diagnosis Date  . Hypertension    No past surgical history on file.  reports that he has never smoked. He does not have any smokeless tobacco history on file. He reports that he drinks about 2.4 ounces of alcohol per week. He reports that he does not use illicit drugs. family history includes Diabetes in his other; Hypertension in his other. No Known Allergies   Review of Systems  Constitutional: Negative for fatigue.  Eyes: Negative for visual disturbance.  Respiratory: Negative for cough, chest tightness and shortness of breath.   Cardiovascular: Negative for chest pain, palpitations and leg swelling.  Neurological: Negative for dizziness, syncope, weakness, light-headedness and headaches.       Objective:   Physical Exam  Constitutional: He appears well-developed and well-nourished.  Neck: Neck supple. No thyromegaly present.  Cardiovascular: Normal rate and regular rhythm.   Pulmonary/Chest: Effort normal and breath sounds normal. No respiratory distress. He has no wheezes. He has no rales.  Musculoskeletal: He exhibits no edema.          Assessment & Plan:  Type 2 diabetes. Recently diagnosed. Poor control and poor compliance. We strongly emphasized importance of dietary compliance, exercise, and weight loss. Future labs with hemoglobin A1c, lipid panel, and urine microalbumin screening.  Monitor blood sugars more closely. We discussed possible referral to nutritionist and at this point he is reluctant.

## 2013-04-22 ENCOUNTER — Ambulatory Visit: Payer: No Typology Code available for payment source | Admitting: Family Medicine

## 2013-05-02 ENCOUNTER — Ambulatory Visit (INDEPENDENT_AMBULATORY_CARE_PROVIDER_SITE_OTHER): Payer: Self-pay | Admitting: Family Medicine

## 2013-05-02 ENCOUNTER — Encounter: Payer: Self-pay | Admitting: Family Medicine

## 2013-05-02 VITALS — BP 140/80 | HR 98 | Wt 281.0 lb

## 2013-05-02 DIAGNOSIS — IMO0001 Reserved for inherently not codable concepts without codable children: Secondary | ICD-10-CM

## 2013-05-02 DIAGNOSIS — R5383 Other fatigue: Secondary | ICD-10-CM

## 2013-05-02 DIAGNOSIS — E1165 Type 2 diabetes mellitus with hyperglycemia: Secondary | ICD-10-CM

## 2013-05-02 DIAGNOSIS — R5381 Other malaise: Secondary | ICD-10-CM

## 2013-05-02 DIAGNOSIS — I10 Essential (primary) hypertension: Secondary | ICD-10-CM

## 2013-05-02 DIAGNOSIS — IMO0002 Reserved for concepts with insufficient information to code with codable children: Secondary | ICD-10-CM

## 2013-05-02 DIAGNOSIS — E669 Obesity, unspecified: Secondary | ICD-10-CM

## 2013-05-02 LAB — GLUCOSE, POCT (MANUAL RESULT ENTRY): POC Glucose: 169 mg/dl — AB (ref 70–99)

## 2013-05-02 MED ORDER — METFORMIN HCL 500 MG PO TABS: 500.0000 mg | ORAL_TABLET | Freq: Two times a day (BID) | ORAL | Status: DC

## 2013-05-02 NOTE — Progress Notes (Signed)
   Subjective:    Patient ID: Peter Mcclure, male    DOB: 06/03/1970, 43 y.o.   MRN: [id number]  HPI Here for medical followup. Patient is back on metformin 500 mg twice daily regularly. He's had poor compliance with diet and exercise. He has gained about 8 pounds since last visit. Last A1c 7.3%. His major complaint is excessive fatigue. Symptoms low motivation. Had some intermittent symptoms of depression but these are inconsistent. He is requesting testosterone level. Recent TSH normal. Denies any chest pains. No dizziness. No dyspnea. Denies any polydipsia or polyuria. No blurred vision. Not monitoring blood sugars regularly but had fasting blood sugar this morning 169. No consistent exercise. Currently takes only metformin. We have planned to get followup lipids along with urine microalbumin.  Past Medical History  Diagnosis Date  . Hypertension    No past surgical history on file.  reports that he has never smoked. He does not have any smokeless tobacco history on file. He reports that he drinks about 2.4 ounces of alcohol per week. He reports that he does not use illicit drugs. family history includes Diabetes in his other; Hypertension in his other. No Known Allergies    Review of Systems  Constitutional: Positive for fever. Negative for fatigue.  Eyes: Negative for visual disturbance.  Respiratory: Negative for cough, chest tightness and shortness of breath.   Cardiovascular: Negative for chest pain, palpitations and leg swelling.  Endocrine: Negative for polydipsia and polyuria.  Neurological: Negative for dizziness, syncope, weakness, light-headedness and headaches.       Objective:   Physical Exam  Constitutional: He appears well-developed and well-nourished.  Neck: Neck supple. No thyromegaly present.  Cardiovascular: Normal rate and regular rhythm.   Pulmonary/Chest: Effort normal and breath sounds normal. No respiratory distress. He has no wheezes. He has no rales.    Musculoskeletal: He exhibits no edema.          Assessment & Plan:  #1 type 2 diabetes. History of poor compliance. We'll schedule fasting labs including A1c for next week #2 fatigue. Likely multifactorial. Probably partly related to lack of exercise. Add testosterone level to labs next week. Recent TSH normal #3 elevated blood pressure. Borderline elevated. Discussed options of ACE inhibitor or ARB versus weight loss and he prefers the latter.

## 2013-05-02 NOTE — Progress Notes (Signed)
Pre visit review using our clinic review tool, if applicable. No additional management support is needed unless otherwise documented below in the visit note. 

## 2013-05-05 ENCOUNTER — Telehealth: Payer: Self-pay | Admitting: Family Medicine

## 2013-05-05 NOTE — Telephone Encounter (Signed)
Relevant patient education mailed to patient.  

## 2013-05-06 ENCOUNTER — Encounter: Payer: Self-pay | Admitting: Family Medicine

## 2013-05-07 ENCOUNTER — Ambulatory Visit: Payer: No Typology Code available for payment source | Admitting: Family Medicine

## 2013-05-09 ENCOUNTER — Ambulatory Visit: Payer: No Typology Code available for payment source | Admitting: Family Medicine

## 2013-05-15 ENCOUNTER — Encounter: Payer: Self-pay | Admitting: Family Medicine

## 2013-05-15 ENCOUNTER — Ambulatory Visit (INDEPENDENT_AMBULATORY_CARE_PROVIDER_SITE_OTHER): Payer: Self-pay | Admitting: Family Medicine

## 2013-05-15 VITALS — BP 134/82 | HR 90 | Wt 280.0 lb

## 2013-05-15 DIAGNOSIS — IMO0002 Reserved for concepts with insufficient information to code with codable children: Secondary | ICD-10-CM

## 2013-05-15 DIAGNOSIS — R5381 Other malaise: Secondary | ICD-10-CM

## 2013-05-15 DIAGNOSIS — E1165 Type 2 diabetes mellitus with hyperglycemia: Secondary | ICD-10-CM

## 2013-05-15 DIAGNOSIS — IMO0001 Reserved for inherently not codable concepts without codable children: Secondary | ICD-10-CM

## 2013-05-15 DIAGNOSIS — R5383 Other fatigue: Secondary | ICD-10-CM

## 2013-05-15 DIAGNOSIS — E119 Type 2 diabetes mellitus without complications: Secondary | ICD-10-CM

## 2013-05-15 LAB — MICROALBUMIN / CREATININE URINE RATIO
Creatinine,U: 235.4 mg/dL
MICROALB/CREAT RATIO: 1.8 mg/g (ref 0.0–30.0)
Microalb, Ur: 4.2 mg/dL — ABNORMAL HIGH (ref 0.0–1.9)

## 2013-05-15 LAB — TESTOSTERONE: Testosterone: 336.86 ng/dL — ABNORMAL LOW (ref 350.00–890.00)

## 2013-05-15 LAB — LIPID PANEL
CHOLESTEROL: 172 mg/dL (ref 0–200)
HDL: 38.9 mg/dL — AB (ref >39.00)
LDL Cholesterol: 76 mg/dL (ref 0–99)
TRIGLYCERIDES: 284 mg/dL — AB (ref 0.0–149.0)
Total CHOL/HDL Ratio: 4
VLDL: 56.8 mg/dL — AB (ref 0.0–40.0)

## 2013-05-15 LAB — HEMOGLOBIN A1C: Hgb A1c MFr Bld: 7 % — ABNORMAL HIGH (ref 4.6–6.5)

## 2013-05-15 NOTE — Progress Notes (Signed)
Pre visit review using our clinic review tool, if applicable. No additional management support is needed unless otherwise documented below in the visit note. 

## 2013-05-15 NOTE — Progress Notes (Signed)
   Subjective:    Patient ID: Peter Mcclure, male    DOB: 01/16/1971, 43 y.o.   MRN: [id number]  HPI Patient seen for medical followup. Refer to recent note. Type 2 diabetes. History of poor compliance. He started back on metformin. Not monitoring blood sugars. No symptoms of hyperglycemia. We had planned on future labs with testosterone, lipid, A1c but he has not had these drawn yet. He has lost 1 pound since last visit. Recently joined a gym and plans to start more consistent exercise. He has made some dietary changes  Past Medical History  Diagnosis Date  . Hypertension    No past surgical history on file.  reports that he has never smoked. He does not have any smokeless tobacco history on file. He reports that he drinks about 2.4 ounces of alcohol per week. He reports that he does not use illicit drugs. family history includes Diabetes in his other; Hypertension in his other. No Known Allergies    Review of Systems  Constitutional: Negative for fatigue.  Eyes: Negative for visual disturbance.  Respiratory: Negative for cough, chest tightness and shortness of breath.   Cardiovascular: Negative for chest pain, palpitations and leg swelling.  Endocrine: Negative for polydipsia and polyuria.  Neurological: Negative for dizziness, syncope, weakness, light-headedness and headaches.       Objective:   Physical Exam  Constitutional: He is oriented to person, place, and time. He appears well-developed and well-nourished.  HENT:  Right Ear: External ear normal.  Left Ear: External ear normal.  Mouth/Throat: Oropharynx is clear and moist.  Eyes: Pupils are equal, round, and reactive to light.  Neck: Neck supple. No thyromegaly present.  Cardiovascular: Normal rate and regular rhythm.   Pulmonary/Chest: Effort normal and breath sounds normal. No respiratory distress. He has no wheezes. He has no rales.  Musculoskeletal: He exhibits no edema.  Neurological: He is alert and oriented to  person, place, and time.          Assessment & Plan:  Type 2 diabetes. Unknown current level control. Check A1c. Check lipid. Check urine microalbumin screen. Continue weight loss efforts. Routine followup 3 months. Titrate up metformin if A1c is not at goal

## 2013-05-16 ENCOUNTER — Telehealth: Payer: Self-pay

## 2013-05-16 ENCOUNTER — Other Ambulatory Visit: Payer: Self-pay | Admitting: Family Medicine

## 2013-05-16 DIAGNOSIS — R7989 Other specified abnormal findings of blood chemistry: Secondary | ICD-10-CM

## 2013-05-16 NOTE — Telephone Encounter (Signed)
Relevant patient education mailed to patient.  

## 2015-02-27 ENCOUNTER — Encounter (HOSPITAL_COMMUNITY): Payer: Self-pay

## 2015-02-27 ENCOUNTER — Emergency Department (HOSPITAL_COMMUNITY)
Admission: EM | Admit: 2015-02-27 | Discharge: 2015-02-27 | Disposition: A | Discharge status: Home / Self Care | Payer: No Typology Code available for payment source | Attending: Emergency Medicine | Admitting: Emergency Medicine

## 2015-02-27 DIAGNOSIS — I10 Essential (primary) hypertension: Secondary | ICD-10-CM | POA: Insufficient documentation

## 2015-02-27 DIAGNOSIS — Y998 Other external cause status: Secondary | ICD-10-CM | POA: Insufficient documentation

## 2015-02-27 DIAGNOSIS — S8991XA Unspecified injury of right lower leg, initial encounter: Secondary | ICD-10-CM | POA: Diagnosis not present

## 2015-02-27 DIAGNOSIS — Z79899 Other long term (current) drug therapy: Secondary | ICD-10-CM | POA: Insufficient documentation

## 2015-02-27 DIAGNOSIS — S3992XA Unspecified injury of lower back, initial encounter: Secondary | ICD-10-CM | POA: Diagnosis not present

## 2015-02-27 DIAGNOSIS — E119 Type 2 diabetes mellitus without complications: Secondary | ICD-10-CM | POA: Diagnosis not present

## 2015-02-27 DIAGNOSIS — Y9389 Activity, other specified: Secondary | ICD-10-CM | POA: Diagnosis not present

## 2015-02-27 DIAGNOSIS — Y92410 Unspecified street and highway as the place of occurrence of the external cause: Secondary | ICD-10-CM | POA: Insufficient documentation

## 2015-02-27 DIAGNOSIS — S0990XA Unspecified injury of head, initial encounter: Secondary | ICD-10-CM | POA: Insufficient documentation

## 2015-02-27 DIAGNOSIS — D849 Immunodeficiency, unspecified: Secondary | ICD-10-CM | POA: Insufficient documentation

## 2015-02-27 LAB — I-STAT CHEM 8, ED
BUN: 9 mg/dL (ref 6–20)
CALCIUM ION: 1.11 mmol/L — AB (ref 1.12–1.23)
Chloride: 102 mmol/L (ref 101–111)
Creatinine, Ser: 0.6 mg/dL — ABNORMAL LOW (ref 0.61–1.24)
Glucose, Bld: 212 mg/dL — ABNORMAL HIGH (ref 65–99)
HCT: 47 % (ref 39.0–52.0)
HEMOGLOBIN: 16 g/dL (ref 13.0–17.0)
Potassium: 4.2 mmol/L (ref 3.5–5.1)
SODIUM: 139 mmol/L (ref 135–145)
TCO2: 26 mmol/L (ref 0–100)

## 2015-02-27 MED ORDER — METFORMIN HCL 500 MG PO TABS: 500.0000 mg | ORAL_TABLET | Freq: Two times a day (BID) | ORAL | Status: DC

## 2015-02-27 MED ORDER — LISINOPRIL 10 MG PO TABS: 10.0000 mg | ORAL_TABLET | Freq: Every day | ORAL | Status: DC

## 2015-02-27 MED ORDER — CYCLOBENZAPRINE HCL 10 MG PO TABS: 10.0000 mg | ORAL_TABLET | Freq: Three times a day (TID) | ORAL | Status: DC | PRN
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
## 2016-03-18 ENCOUNTER — Emergency Department (HOSPITAL_COMMUNITY): Payer: Self-pay

## 2016-03-18 ENCOUNTER — Emergency Department (HOSPITAL_COMMUNITY)
Admission: EM | Admit: 2016-03-18 | Discharge: 2016-03-18 | Disposition: A | Discharge status: Home / Self Care | Payer: Self-pay | Attending: Emergency Medicine | Admitting: Emergency Medicine

## 2016-03-18 ENCOUNTER — Encounter (HOSPITAL_COMMUNITY): Payer: Self-pay | Admitting: Emergency Medicine

## 2016-03-18 DIAGNOSIS — R739 Hyperglycemia, unspecified: Secondary | ICD-10-CM

## 2016-03-18 DIAGNOSIS — R1030 Lower abdominal pain, unspecified: Secondary | ICD-10-CM

## 2016-03-18 DIAGNOSIS — I10 Essential (primary) hypertension: Secondary | ICD-10-CM | POA: Insufficient documentation

## 2016-03-18 DIAGNOSIS — R935 Abnormal findings on diagnostic imaging of other abdominal regions, including retroperitoneum: Secondary | ICD-10-CM | POA: Insufficient documentation

## 2016-03-18 DIAGNOSIS — Z7984 Long term (current) use of oral hypoglycemic drugs: Secondary | ICD-10-CM | POA: Insufficient documentation

## 2016-03-18 DIAGNOSIS — R9389 Abnormal findings on diagnostic imaging of other specified body structures: Secondary | ICD-10-CM

## 2016-03-18 DIAGNOSIS — E860 Dehydration: Secondary | ICD-10-CM | POA: Insufficient documentation

## 2016-03-18 DIAGNOSIS — E1165 Type 2 diabetes mellitus with hyperglycemia: Secondary | ICD-10-CM | POA: Insufficient documentation

## 2016-03-18 LAB — URINALYSIS, ROUTINE W REFLEX MICROSCOPIC
Glucose, UA: >=500 mg/dL — AB
Hgb urine dipstick: NEGATIVE
KETONES UR: 15 mg/dL — AB
Leukocytes, UA: NEGATIVE
Nitrite: NEGATIVE
PH: 5.5 (ref 5.0–8.0)
Protein, ur: 100 mg/dL — AB
Specific Gravity, Urine: 1.03 (ref 1.005–1.030)

## 2016-03-18 LAB — COMPREHENSIVE METABOLIC PANEL
ALK PHOS: 236 U/L — AB (ref 38–126)
ALT: 46 U/L (ref 17–63)
AST: 36 U/L (ref 15–41)
Albumin: 3.8 g/dL (ref 3.5–5.0)
Anion gap: 10 (ref 5–15)
BILIRUBIN TOTAL: 1.4 mg/dL — AB (ref 0.3–1.2)
BUN: 10 mg/dL (ref 6–20)
CALCIUM: 8.8 mg/dL — AB (ref 8.9–10.3)
CO2: 21 mmol/L — ABNORMAL LOW (ref 22–32)
Chloride: 101 mmol/L (ref 101–111)
Creatinine, Ser: 0.65 mg/dL (ref 0.61–1.24)
GFR calc Af Amer: >60 mL/min (ref >60)
GFR calc non Af Amer: >60 mL/min (ref >60)
GLUCOSE: 293 mg/dL — AB (ref 65–99)
Potassium: 3.5 mmol/L (ref 3.5–5.1)
Sodium: 132 mmol/L — ABNORMAL LOW (ref 135–145)
TOTAL PROTEIN: 8.5 g/dL — AB (ref 6.5–8.1)

## 2016-03-18 LAB — CBC
HCT: 43.2 % (ref 39.0–52.0)
Hemoglobin: 14.7 g/dL (ref 13.0–17.0)
MCH: 30.4 pg (ref 26.0–34.0)
MCHC: 34 g/dL (ref 30.0–36.0)
MCV: 89.4 fL (ref 78.0–100.0)
Platelets: 261 10*3/uL (ref 150–400)
RBC: 4.83 MIL/uL (ref 4.22–5.81)
RDW: 12.4 % (ref 11.5–15.5)
WBC: 5 10*3/uL (ref 4.0–10.5)

## 2016-03-18 LAB — I-STAT CHEM 8, ED
BUN: 6 mg/dL (ref 6–20)
Calcium, Ion: 1.16 mmol/L (ref 1.15–1.40)
Chloride: 103 mmol/L (ref 101–111)
Creatinine, Ser: 0.6 mg/dL — ABNORMAL LOW (ref 0.61–1.24)
Glucose, Bld: 212 mg/dL — ABNORMAL HIGH (ref 65–99)
HCT: 42 % (ref 39.0–52.0)
HEMOGLOBIN: 14.3 g/dL (ref 13.0–17.0)
Potassium: 4 mmol/L (ref 3.5–5.1)
SODIUM: 139 mmol/L (ref 135–145)
TCO2: 25 mmol/L (ref 0–100)

## 2016-03-18 LAB — URINALYSIS, MICROSCOPIC (REFLEX)
Bacteria, UA: NONE SEEN
RBC / HPF: NONE SEEN RBC/hpf (ref 0–5)

## 2016-03-18 LAB — CBG MONITORING, ED
GLUCOSE-CAPILLARY: 213 mg/dL — AB (ref 65–99)
GLUCOSE-CAPILLARY: 214 mg/dL — AB (ref 65–99)

## 2016-03-18 LAB — LIPASE, BLOOD: Lipase: 21 U/L (ref 11–51)

## 2016-03-18 MED ORDER — SODIUM CHLORIDE 0.9 % IV BOLUS (SEPSIS)
1000.0000 mL | Freq: Once | INTRAVENOUS | Status: AC
  Administered 2016-03-18: 1000 mL via INTRAVENOUS

## 2016-03-18 MED ORDER — DICYCLOMINE HCL 20 MG PO TABS: 20.0000 mg | ORAL_TABLET | Freq: Three times a day (TID) | ORAL | 0 refills | Status: DC

## 2016-03-18 MED ORDER — AMLODIPINE BESYLATE 5 MG PO TABS: 5.0000 mg | ORAL_TABLET | Freq: Every day | ORAL | 0 refills | Status: DC

## 2016-03-18 MED ORDER — LACTATED RINGERS IV BOLUS (SEPSIS)
1000.0000 mL | Freq: Once | INTRAVENOUS | Status: AC
  Administered 2016-03-18: 1000 mL via INTRAVENOUS

## 2016-03-18 MED ORDER — METFORMIN HCL 500 MG PO TABS: 500.0000 mg | ORAL_TABLET | Freq: Two times a day (BID) | ORAL | 0 refills | Status: DC

## 2016-03-18 MED ORDER — ONDANSETRON HCL 4 MG/2ML IJ SOLN
4.0000 mg | Freq: Once | INTRAMUSCULAR | Status: AC
  Administered 2016-03-18: 4 mg via INTRAVENOUS
  Filled 2016-03-18: qty 2

## 2016-03-18 MED ORDER — IOPAMIDOL (ISOVUE-300) INJECTION 61%
INTRAVENOUS | Status: AC
  Administered 2016-03-18: 11:00:00
  Filled 2016-03-18: qty 100

## 2016-03-18 MED ORDER — INSULIN ASPART 100 UNIT/ML ~~LOC~~ SOLN
5.0000 [IU] | Freq: Once | SUBCUTANEOUS | Status: AC
  Administered 2016-03-18: 5 [IU] via INTRAVENOUS
  Filled 2016-03-18: qty 1

## 2016-03-18 MED ORDER — SODIUM CHLORIDE 0.9 % IV BOLUS (SEPSIS): 1000.0000 mL | Freq: Once | INTRAVENOUS | Status: DC

## 2016-03-18 MED ORDER — MORPHINE SULFATE (PF) 4 MG/ML IV SOLN
4.0000 mg | Freq: Once | INTRAVENOUS | Status: AC
  Administered 2016-03-18: 4 mg via INTRAVENOUS
  Filled 2016-03-18: qty 1

## 2016-03-18 MED ORDER — IOPAMIDOL (ISOVUE-300) INJECTION 61%
100.0000 mL | Freq: Once | INTRAVENOUS | Status: AC | PRN
  Administered 2016-03-18: 100 mL via INTRAVENOUS

## 2016-03-18 NOTE — ED Notes (Signed)
Nurse is in the room starting an and collecting labs

## 2016-03-18 NOTE — ED Provider Notes (Signed)
[facility]-EMERGENCY DEPT Provider Note   CSN: [id number] Arrival date & time: 03/18/16  0725     History   Chief Complaint Chief Complaint  Patient presents with  . Abdominal Pain  . Back Pain  . Hypertension    HPI Peter Mcclure is a 46 y.o. male.  HPI 46 yo M with h/o HTN here with chronic lower abdominal pain. Pt states that for the past 3 months, he has had intermittent cramping, aching, lower abdominal pain. Pain seems to come and go without apparent trigger. No relation with eating or drinking. He has had associated decreased BM and "thin" bowel movements. No blood in stools. He also has intermittent dysuria and frequency. No hematuria. No fever, chills, weight loss, or night sweats. Denies any other complaints.  Past Medical History:  Diagnosis Date  . Hypertension     Patient Active Problem List   Diagnosis Date Noted  . Obesity (BMI 30-39.9) 01/20/2013  . Diabetes mellitus type 2, uncontrolled (HCC) 01/20/2013  . HEMATOCHEZIA 03/30/2009  . 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 HYPERTENSION 11/13/2008  . GERD 11/13/2008    History reviewed. No pertinent surgical history.     Home Medications    Prior to Admission medications   Medication Sig Start Date End Date Taking? Authorizing Provider  amLODipine (NORVASC) 5 MG tablet Take 1 tablet (5 mg total) by mouth daily. 03/18/16   Shaune Pollack, MD  cyclobenzaprine (FLEXERIL) 10 MG tablet Take 1 tablet (10 mg total) by mouth 3 (three) times daily as needed for muscle spasms (and pain). Patient not taking: Reported on 03/18/2016 02/27/15   Trixie Dredge, PA-C  dicyclomine (BENTYL) 20 MG tablet Take 1 tablet (20 mg total) by mouth 4 (four) times daily -  before meals and at bedtime. 03/18/16   Shaune Pollack, MD  metFORMIN (GLUCOPHAGE) 500 MG tablet Take 1 tablet (500 mg total) by mouth 2 (two) times daily with a meal. 03/18/16 04/17/16  Shaune Pollack, MD    Family History Family History  Problem Relation Age of Onset  .  Diabetes Other   . Hypertension Other     Social History Social History  Substance Use Topics  . Smoking status: Never Smoker  . Smokeless tobacco: Not on file  . Alcohol use 2.4 oz/week    4 Cans of beer per week     Comment: 4-6 beers a day     Allergies   Patient has no known allergies.   Review of Systems Review of Systems  Constitutional: Positive for fatigue. Negative for chills and fever.  HENT: Negative for congestion and rhinorrhea.   Eyes: Negative for visual disturbance.  Respiratory: Negative for cough, shortness of breath and wheezing.   Cardiovascular: Negative for chest pain and leg swelling.  Gastrointestinal: Positive for abdominal pain and nausea. Negative for diarrhea and vomiting.  Genitourinary: Positive for frequency. Negative for dysuria and flank pain.  Musculoskeletal: Negative for neck pain and neck stiffness.  Skin: Negative for rash and wound.  Allergic/Immunologic: Negative for immunocompromised state.  Neurological: Negative for syncope, weakness and headaches.  All other systems reviewed and are negative.    Physical Exam Updated Vital Signs BP 132/85 (BP Location: Right Arm)   Pulse 85   Temp 98.2 F (36.8 C) (Oral)   Resp 16   Ht 6\' 3"  (1.905 m)   Wt 242 lb (109.8 kg)   SpO2 99%   BMI 30.25 kg/m   Physical Exam  Constitutional: He is oriented to  person, place, and time. He appears well-developed and well-nourished. No distress.  HENT:  Head: Normocephalic and atraumatic.  Eyes: Conjunctivae are normal.  Neck: Neck supple.  Cardiovascular: Normal rate, regular rhythm and normal heart sounds.  Exam reveals no friction rub.   No murmur heard. Pulmonary/Chest: Effort normal and breath sounds normal. No respiratory distress. He has no wheezes. He has no rales.  Abdominal: Soft. Bowel sounds are normal. He exhibits no distension. There is tenderness. There is guarding. There is no rebound.  Musculoskeletal: He exhibits no edema.    Neurological: He is alert and oriented to person, place, and time. He exhibits normal muscle tone.  Skin: Skin is warm. Capillary refill takes less than 2 seconds.  Psychiatric: He has a normal mood and affect.  Nursing note and vitals reviewed.    ED Treatments / Results  Labs (all labs ordered are listed, but only abnormal results are displayed) Labs Reviewed  COMPREHENSIVE METABOLIC PANEL - Abnormal; Notable for the following:       Result Value   Sodium 132 (*)    CO2 21 (*)    Glucose, Bld 293 (*)    Calcium 8.8 (*)    Total Protein 8.5 (*)    Alkaline Phosphatase 236 (*)    Total Bilirubin 1.4 (*)    All other components within normal limits  URINALYSIS, ROUTINE W REFLEX MICROSCOPIC - Abnormal; Notable for the following:    Glucose, UA >=500 (*)    Bilirubin Urine MODERATE (*)    Ketones, ur 15 (*)    Protein, ur 100 (*)    All other components within normal limits  URINALYSIS, MICROSCOPIC (REFLEX) - Abnormal; Notable for the following:    Squamous Epithelial / LPF 0-5 (*)    Crystals PRESENT (*)    All other components within normal limits  CBG MONITORING, ED - Abnormal; Notable for the following:    Glucose-Capillary 213 (*)    All other components within normal limits  I-STAT CHEM 8, ED - Abnormal; Notable for the following:    Creatinine, Ser 0.60 (*)    Glucose, Bld 212 (*)    All other components within normal limits  CBG MONITORING, ED - Abnormal; Notable for the following:    Glucose-Capillary 214 (*)    All other components within normal limits  LIPASE, BLOOD  CBC    EKG  EKG Interpretation None       Radiology Dg Chest 2 View  Result Date: 03/18/2016 CLINICAL DATA:  Cough, low back and abdominal pain EXAM: CHEST  2 VIEW COMPARISON:  03/18/2016 CT with contrast FINDINGS: The heart size and mediastinal contours are within normal limits. Both lungs are clear. The visualized skeletal structures are unremarkable. IMPRESSION: No active cardiopulmonary  disease. Electronically Signed   By: Judie Petit.  Shick M.D.   On: 03/18/2016 10:33   Ct Abdomen Pelvis W Contrast  Result Date: 03/18/2016 CLINICAL DATA:  Low back and abdominal pain for 3 months which worsened yesterday. EXAM: CT ABDOMEN AND PELVIS WITH CONTRAST TECHNIQUE: Multidetector CT imaging of the abdomen and pelvis was performed using the standard protocol following bolus administration of intravenous contrast. CONTRAST:  100 ml ISOVUE-300 IOPAMIDOL (ISOVUE-300) INJECTION 61% COMPARISON:  None. FINDINGS: Lower chest: Lung bases are clear. No pleural or pericardial effusion. Hepatobiliary: The liver is low attenuating consistent with fatty infiltration. No focal lesion. Gallbladder and biliary tree appear normal. Pancreas: Unremarkable. No pancreatic ductal dilatation or surrounding inflammatory changes. Spleen: Innumerable small low attenuating  lesions are seen throughout the spleen on early imaging which are not visible on renal delayed imaging. Spleen size is normal. Adrenals/Urinary Tract: Adrenal glands are unremarkable. Kidneys are normal, without renal calculi, focal lesion, or hydronephrosis. Bladder is unremarkable. Stomach/Bowel: A few scattered diverticula are seen without diverticulitis. The stomach, small bowel and appendix appear normal. Vascular/Lymphatic: No significant vascular findings are present. No enlarged abdominal or pelvic lymph nodes. Reproductive: Prostate is unremarkable. Other: Fat containing supraumbilical hernia is noted. No fluid collection. Musculoskeletal: Mild degenerative changes seen about the hips. No worrisome lesion. No fracture. IMPRESSION: Innumerable hypoattenuating lesions within the spleen on early phase imaging are nonspecific. Differential considerations include sarcoidosis, fungal infection such as histoplasmosis or less likely neoplastic process such as lymphoma. Nonemergent MRI with and without contrast is recommended for further evaluation. Fatty infiltration of  the liver. Diverticulosis without diverticulitis. Small fat containing supraumbilical hernia. Electronically Signed   By: Drusilla Kanner M.D.   On: 03/18/2016 10:06    Procedures Procedures (including critical care time)  Medications Ordered in ED Medications  sodium chloride 0.9 % bolus 1,000 mL (0 mLs Intravenous Stopped 03/18/16 1102)  morphine 4 MG/ML injection 4 mg (4 mg Intravenous Given 03/18/16 0957)  ondansetron (ZOFRAN) injection 4 mg (4 mg Intravenous Given 03/18/16 0957)  iopamidol (ISOVUE-300) 61 % injection 100 mL (100 mLs Intravenous Contrast Given 03/18/16 0944)  iopamidol (ISOVUE-300) 61 % injection (  Contrast Given 03/18/16 1058)  sodium chloride 0.9 % bolus 1,000 mL (0 mLs Intravenous Stopped 03/18/16 1202)  insulin aspart (novoLOG) injection 5 Units (5 Units Intravenous Given 03/18/16 1204)  lactated ringers bolus 1,000 mL (0 mLs Intravenous Stopped 03/18/16 1259)     Initial Impression / Assessment and Plan / ED Course  I have reviewed the triage vital signs and the nursing notes.  Pertinent labs & imaging results that were available during my care of the patient were reviewed by me and considered in my medical decision making (see chart for details).  Clinical Course     46 year old male who presents with several months of intermittent lower abdominal pain. On arrival, vital signs are stable. He is hypertensive which is his baseline per review of records. Exam is as above. Labwork shows no leukocytosis. CMP with mild dehydration with bicarbonate 21, glucose 293. He has normal anion gap and no significant evidence of DKA. Alkaline phosphatase and bili also up, likely due to dehydration. CT subsequently obtained and shows nonspecific hypoattenuating lesions in the spleen. This does not correlate with his area of tenderness and is likely incidental. Will refer him to his PCP for MRI. DDx includes histo so CXR obtained which is neg. Normal hilum on CXR as well, no h/o sarcoid though  this is also on DDx - will need close follow-up. No h/o embolic disease, no murmur, no s/s endocarditis or valvular disease. No other lesions. No h/o TB or travel outside [place]. CBC with normal WBC making leukemia/lymphoma less likely. Otherwise, urinalysis shows mild dehydration. He has been given fluids with resolution of his tachycardia. Will begin him on an antihypertensive per his request as he is no longer taking ACEI, start Bentyl for possible IBS, and refer for GI follow-up as he may need a colonoscopy.  Of note, pt also hyperglycemic. He stopped his metformin. This improved with IVF, insulin and BG<300 at this time. Repeat istat chem shows resolved, mild acidosis with normal AG - no signs of DKA. Will restart metformin.  Final Clinical Impressions(s) / ED Diagnoses  Final diagnoses:  Lower abdominal pain  Abnormal CT scan  Dehydration  Hyperglycemia    New Prescriptions Discharge Medication List as of 03/18/2016 11:48 AM    START taking these medications   Details  amLODipine (NORVASC) 5 MG tablet Take 1 tablet (5 mg total) by mouth daily., Starting Sat 03/18/2016, Print    dicyclomine (BENTYL) 20 MG tablet Take 1 tablet (20 mg total) by mouth 4 (four) times daily -  before meals and at bedtime., Starting Sat 03/18/2016, Print         Shaune Pollackameron Zynasia Burklow, MD 03/18/16 [id number]

## 2016-03-18 NOTE — ED Notes (Signed)
Patient transported to CT 

## 2016-03-18 NOTE — ED Triage Notes (Signed)
Pt complaint of lower back and lower abdominal pain for past 3 months worsening yesterday. Pt denies GU symptoms and verbalizes small BM yesterday. Pt continues to reports hypertension without prescribed treatment regimen.

## 2016-11-13 ENCOUNTER — Encounter (HOSPITAL_COMMUNITY): Payer: Self-pay | Admitting: Emergency Medicine

## 2016-11-13 ENCOUNTER — Emergency Department (HOSPITAL_COMMUNITY)
Admission: EM | Admit: 2016-11-13 | Discharge: 2016-11-13 | Disposition: A | Discharge status: Home / Self Care | Payer: Self-pay | Attending: Emergency Medicine | Admitting: Emergency Medicine

## 2016-11-13 ENCOUNTER — Emergency Department (HOSPITAL_COMMUNITY): Payer: Self-pay

## 2016-11-13 DIAGNOSIS — I10 Essential (primary) hypertension: Secondary | ICD-10-CM | POA: Insufficient documentation

## 2016-11-13 DIAGNOSIS — R1032 Left lower quadrant pain: Secondary | ICD-10-CM | POA: Insufficient documentation

## 2016-11-13 DIAGNOSIS — Z7984 Long term (current) use of oral hypoglycemic drugs: Secondary | ICD-10-CM | POA: Insufficient documentation

## 2016-11-13 LAB — URINALYSIS, ROUTINE W REFLEX MICROSCOPIC
BILIRUBIN URINE: NEGATIVE
Bacteria, UA: NONE SEEN
HGB URINE DIPSTICK: NEGATIVE
Ketones, ur: 5 mg/dL — AB
LEUKOCYTES UA: NEGATIVE
NITRITE: NEGATIVE
Protein, ur: NEGATIVE mg/dL
RBC / HPF: NONE SEEN RBC/hpf (ref 0–5)
SPECIFIC GRAVITY, URINE: 1.025 (ref 1.005–1.030)
Squamous Epithelial / LPF: NONE SEEN
WBC, UA: NONE SEEN WBC/hpf (ref 0–5)
pH: 5 (ref 5.0–8.0)

## 2016-11-13 LAB — COMPREHENSIVE METABOLIC PANEL
ALT: 64 U/L — ABNORMAL HIGH (ref 17–63)
ANION GAP: 10 (ref 5–15)
AST: 38 U/L (ref 15–41)
Albumin: 4.2 g/dL (ref 3.5–5.0)
Alkaline Phosphatase: 112 U/L (ref 38–126)
BUN: 13 mg/dL (ref 6–20)
CO2: 24 mmol/L (ref 22–32)
Calcium: 10.1 mg/dL (ref 8.9–10.3)
Chloride: 103 mmol/L (ref 101–111)
Creatinine, Ser: 0.68 mg/dL (ref 0.61–1.24)
Glucose, Bld: 241 mg/dL — ABNORMAL HIGH (ref 65–99)
POTASSIUM: 3.9 mmol/L (ref 3.5–5.1)
Sodium: 137 mmol/L (ref 135–145)
TOTAL PROTEIN: 8.1 g/dL (ref 6.5–8.1)
Total Bilirubin: 1.2 mg/dL (ref 0.3–1.2)

## 2016-11-13 LAB — CBC
HCT: 41.5 % (ref 39.0–52.0)
Hemoglobin: 14.8 g/dL (ref 13.0–17.0)
MCH: 33.2 pg (ref 26.0–34.0)
MCHC: 35.7 g/dL (ref 30.0–36.0)
MCV: 93 fL (ref 78.0–100.0)
Platelets: 247 10*3/uL (ref 150–400)
RBC: 4.46 MIL/uL (ref 4.22–5.81)
RDW: 11.2 % — AB (ref 11.5–15.5)
WBC: 6.5 10*3/uL (ref 4.0–10.5)

## 2016-11-13 MED ORDER — IBUPROFEN 600 MG PO TABS: 600.0000 mg | ORAL_TABLET | Freq: Four times a day (QID) | ORAL | 0 refills | Status: DC | PRN

## 2016-11-13 NOTE — ED Triage Notes (Signed)
Pt states he has had L flank pain x 2 days. Denies N/V/D. Alert and oriented.

## 2016-11-13 NOTE — ED Provider Notes (Signed)
[facility]-EMERGENCY DEPT Provider Note   CSN: [id number] Arrival date & time: 11/13/16  1444     History   Chief Complaint Chief Complaint  Patient presents with  . Flank Pain    HPI Peter Mcclure is a 46 y.o. male.  HPI  46 y.o. male with a hx of HTN, presents to the Emergency Department today due to left flank pain x 2 days. Notes intermittent pain. Pt states pain comes in waves and occurred at night. Notes pain woke up from sleep in left flank/ lower back. No associated dysuria or hematuria. States pain occurred x 15 min ago and was sharp 10/10 pain. No pain currently. No N/V. No chills. No CP/SOB. No headaches. No fevers. No diarrhea. No hematochezia.  Normal BMs. No meds PTA. No other symptoms noted   Past Medical History:  Diagnosis Date  . Hypertension     Patient Active Problem List   Diagnosis Date Noted  . Obesity (BMI 30-39.9) 01/20/2013  . Diabetes mellitus type 2, uncontrolled (HCC) 01/20/2013  . HEMATOCHEZIA 03/30/2009  . 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 HYPERTENSION 11/13/2008  . GERD 11/13/2008    History reviewed. No pertinent surgical history.     Home Medications    Prior to Admission medications   Medication Sig Start Date End Date Taking? Authorizing Provider  amLODipine (NORVASC) 5 MG tablet Take 1 tablet (5 mg total) by mouth daily. 03/18/16   Shaune PollackIsaacs, Cameron, MD  cyclobenzaprine (FLEXERIL) 10 MG tablet Take 1 tablet (10 mg total) by mouth 3 (three) times daily as needed for muscle spasms (and pain). Patient not taking: Reported on 03/18/2016 02/27/15   Trixie DredgeWest, Emily, PA-C  dicyclomine (BENTYL) 20 MG tablet Take 1 tablet (20 mg total) by mouth 4 (four) times daily -  before meals and at bedtime. 03/18/16   Shaune PollackIsaacs, Cameron, MD  metFORMIN (GLUCOPHAGE) 500 MG tablet Take 1 tablet (500 mg total) by mouth 2 (two) times daily with a meal. 03/18/16 04/17/16  Shaune PollackIsaacs, Cameron, MD    Family History Family History  Problem Relation Age of Onset  . Diabetes  Other   . Hypertension Other     Social History Social History  Substance Use Topics  . Smoking status: Never Smoker  . Smokeless tobacco: Not on file  . Alcohol use 2.4 oz/week    4 Cans of beer per week     Comment: 4-6 beers a day     Allergies   Patient has no known allergies.   Review of Systems Review of Systems ROS reviewed and all are negative for acute change except as noted in the HPI.  Physical Exam Updated Vital Signs BP (!) 174/102 (BP Location: Left Arm)   Pulse 94   Temp 98.4 F (36.9 C) (Oral)   Resp 16   SpO2 99%   Physical Exam  Constitutional: He is oriented to person, place, and time. Vital signs are normal. He appears well-developed and well-nourished. No distress.  HENT:  Head: Normocephalic and atraumatic.  Right Ear: Hearing, tympanic membrane, external ear and ear canal normal.  Left Ear: Hearing, tympanic membrane, external ear and ear canal normal.  Nose: Nose normal.  Mouth/Throat: Uvula is midline, oropharynx is clear and moist and mucous membranes are normal. No trismus in the jaw. No oropharyngeal exudate, posterior oropharyngeal erythema or tonsillar abscesses.  Eyes: Pupils are equal, round, and reactive to light. Conjunctivae and EOM are normal.  Neck: Normal range of motion. Neck supple. No tracheal deviation  present.  Cardiovascular: Normal rate, regular rhythm, S1 normal, S2 normal, normal heart sounds, intact distal pulses and normal pulses.   Pulmonary/Chest: Effort normal and breath sounds normal. No respiratory distress. He has no decreased breath sounds. He has no wheezes. He has no rhonchi. He has no rales.  Abdominal: Normal appearance and bowel sounds are normal. There is tenderness in the left lower quadrant. There is no rigidity, no rebound, no guarding, no CVA tenderness, no tenderness at McBurney's point and negative Murphy's sign.  LLQ pain. Abdomen soft. Mild left CVA  Musculoskeletal: Normal range of motion.    Neurological: He is alert and oriented to person, place, and time.  Skin: Skin is warm and dry.  Psychiatric: He has a normal mood and affect. His speech is normal and behavior is normal. Thought content normal.  Nursing note and vitals reviewed.    ED Treatments / Results  Labs (all labs ordered are listed, but only abnormal results are displayed) Labs Reviewed  URINALYSIS, ROUTINE W REFLEX MICROSCOPIC - Abnormal; Notable for the following:       Result Value   Glucose, UA >=500 (*)    Ketones, ur 5 (*)    All other components within normal limits  CBC - Abnormal; Notable for the following:    RDW 11.2 (*)    All other components within normal limits  COMPREHENSIVE METABOLIC PANEL - Abnormal; Notable for the following:    Glucose, Bld 241 (*)    ALT 64 (*)    All other components within normal limits    EKG  EKG Interpretation None       Radiology Ct Renal Stone Study  Result Date: 11/13/2016 CLINICAL DATA:  Left flank pain for 2 days EXAM: CT ABDOMEN AND PELVIS WITHOUT CONTRAST TECHNIQUE: Multidetector CT imaging of the abdomen and pelvis was performed following the standard protocol without IV contrast. COMPARISON:  March 18, 2016 FINDINGS: Lower chest: 5 mm nodule is identified in the medial posterior basal segment of the left lower lobe unchanged. There is no focal pneumonia. The heart size is normal. Hepatobiliary: Mild diffuse low density of liver is identified without focal liver lesion. The gallbladder and biliary tree are normal. Pancreas: Unremarkable. No pancreatic ductal dilatation or surrounding inflammatory changes. Spleen: Normal in size without focal abnormality. Adrenals/Urinary Tract: Adrenal glands are unremarkable. Kidneys are normal, without renal calculi, focal lesion, or hydronephrosis. Bladder is unremarkable. Stomach/Bowel: Stomach is within normal limits. Appendix appears normal. No evidence of bowel wall thickening, distention, or inflammatory changes.  Vascular/Lymphatic: No significant vascular findings are present. No enlarged abdominal or pelvic lymph nodes. Reproductive: Prostate is unremarkable. Other: There is 2.5 cm umbilical herniation of mesenteric fat. Musculoskeletal: No acute or significant osseous findings. IMPRESSION: No nephrolithiasis or hydroureteronephrosis is identified bilaterally. No acute abnormality identified in the abdomen pelvis. Fatty infiltration of liver. Stable 5 mm nodule in the left lower lobe unchanged since January 2018. No follow-up needed if patient is low-risk. Non-contrast chest CT can be considered in 12 months if patient is high-risk. This recommendation follows the consensus statement: Guidelines for Management of Incidental Pulmonary Nodules Detected on CT Images: From the Fleischner Society 2017; Radiology 2017; 284:228-243. Electronically Signed   By: Sherian Rein M.D.   On: 11/13/2016 17:14    Procedures Procedures (including critical care time)  Medications Ordered in ED Medications - No data to display   Initial Impression / Assessment and Plan / ED Course  I have reviewed the triage vital  signs and the nursing notes.  Pertinent labs & imaging results that were available during my care of the patient were reviewed by me and considered in my medical decision making (see chart for details).  Final Clinical Impressions(s) / ED Diagnoses  {I have reviewed and evaluated the relevant laboratory values. {I have reviewed and evaluated the relevant imaging studies.  {I have reviewed the relevant previous healthcare records.  {I obtained HPI from historian.   ED Course:  Assessment: Pt is a 46 y.o. male with a hx of HTN, presents to the Emergency Department today due to left flank pain x 2 days. Notes intermittent pain. Pt states pain comes in waves and occurred at night. Notes pain woke up from sleep in left flank/ lower back. No associated dysuria or hematuria. States pain occurred x 15 min ago and was  sharp 10/10 pain. No pain currently. No N/V. No chills. No CP/SOB. No headaches. No fevers. No meds PTA.  On exam, pt in NAD. Nontoxic/nonseptic appearing. VSS. Afebrile. Lungs CTA. Heart RRR. Abdomen mild TTP LLQ. Mild CVA on left. CBC unremarkable. CMP unremarkable. UA unremarkble. CT Renal unremarkable. Recently passed stone vs musculoskeletal? Denies pain currently. Plan is to DC home with follow up to PCP. At time of discharge, Patient is in no acute distress. Vital Signs are stable. Patient is able to ambulate. Patient able to tolerate PO.   Disposition/Plan:  DC Home Additional Verbal discharge instructions given and discussed with patient.  Pt Instructed to f/u with PCP in the next week for evaluation and treatment of symptoms. Return precautions given Pt acknowledges and agrees with plan  Supervising Physician Rolan Bucco, MD  Final diagnoses:  Left lower quadrant pain    New Prescriptions New Prescriptions   No medications on file     Audry Pili, Cordelia Poche 11/13/16 1806    Rolan Bucco, MD 11/13/16 2216

## 2016-11-13 NOTE — Discharge Instructions (Signed)
Please read and follow all provided instructions.  Your diagnoses today include:  1. Left lower quadrant pain     Tests performed today include: Vital signs. See below for your results today.   Medications prescribed:  Take as prescribed   Home care instructions:  Follow any educational materials contained in this packet.  Follow-up instructions: Please follow-up with your primary care provider for further evaluation of symptoms and treatment   Return instructions:  Please return to the Emergency Department if you do not get better, if you get worse, or new symptoms OR  - Fever (temperature greater than 101.62F)  - Bleeding that does not stop with holding pressure to the area    -Severe pain (please note that you may be more sore the day after your accident)  - Chest Pain  - Difficulty breathing  - Severe nausea or vomiting  - Inability to tolerate food and liquids  - Passing out  - Skin becoming red around your wounds  - Change in mental status (confusion or lethargy)  - New numbness or weakness    Please return if you have any other emergent concerns.  Additional Information:  Your vital signs today were: BP (!) 143/97 (BP Location: Right Arm)    Pulse 83    Temp 97.6 F (36.4 C) (Oral)    Resp 20    SpO2 97%  If your blood pressure (BP) was elevated above 135/85 this visit, please have this repeated by your doctor within one month. ---------------

## 2017-07-15 ENCOUNTER — Emergency Department (HOSPITAL_COMMUNITY)
Admission: EM | Admit: 2017-07-15 | Discharge: 2017-07-15 | Disposition: A | Discharge status: Home / Self Care | Payer: Self-pay | Attending: Emergency Medicine | Admitting: Emergency Medicine

## 2017-07-15 ENCOUNTER — Encounter (HOSPITAL_COMMUNITY): Payer: Self-pay | Admitting: Emergency Medicine

## 2017-07-15 DIAGNOSIS — R51 Headache: Secondary | ICD-10-CM | POA: Insufficient documentation

## 2017-07-15 DIAGNOSIS — I10 Essential (primary) hypertension: Secondary | ICD-10-CM | POA: Insufficient documentation

## 2017-07-15 DIAGNOSIS — Z7984 Long term (current) use of oral hypoglycemic drugs: Secondary | ICD-10-CM | POA: Insufficient documentation

## 2017-07-15 DIAGNOSIS — E119 Type 2 diabetes mellitus without complications: Secondary | ICD-10-CM | POA: Insufficient documentation

## 2017-07-15 DIAGNOSIS — R519 Headache, unspecified: Secondary | ICD-10-CM

## 2017-07-15 MED ORDER — LISINOPRIL 10 MG PO TABS: 10.0000 mg | ORAL_TABLET | Freq: Every day | ORAL | 0 refills | Status: DC

## 2017-07-15 NOTE — ED Provider Notes (Signed)
[facility]-EMERGENCY DEPT Provider Note   CSN: [id number] Arrival date & time: 07/15/17  1816     History   Chief Complaint Chief Complaint  Patient presents with  . Hypertension    HPI Peter Mcclure is a 47 y.o. male with a PMHx of HTN, who presents to the ED with complaints of HTN as well as mild HA x2 days.  Patient states that he has had intermittent headaches for the last 2 days, states that they are very mild and somewhat similar to prior headaches he has had in the past.  He denies that this is the worst headache of his life, and denies thunderclap onset.  Describes the headaches as 3/10 intermittent frontal pressure-like headache, nonradiating, with no known aggravating factors, and unrelieved with Tylenol.  He states that he checked his blood pressure today and it was elevated so he came here for further evaluation.  He admits that he stopped taking his lisinopril (  QD) >60yr ago because he could not follow-up with his PCP due to financial reasons.  He denies having any issues with his lisinopril and was able to afford the medication, just could not afford the PCP visits.  Chart review reveals that he was started on Norvasc  daily when he visited the ER on 03/18/16; he states that he never filled that prescription.  He is not currently on any BP meds, however he still takes his metformin.  He denies any vision changes, lightheadedness, fevers, chills, URI symptoms, cough, LE swelling, CP, SOB, abd pain, N/V/D/C, hematuria, dysuria, myalgias, arthralgias, numbness, tingling, focal weakness, or any other complaints at this time.  Denies recent head injury.  He is a nonsmoker.   The history is provided by the patient and medical records. No language interpreter was used.  Hypertension  Associated symptoms include headaches. Pertinent negatives include no chest pain, no abdominal pain and no shortness of breath.    Past Medical History:  Diagnosis Date  .  Hypertension     Patient Active Problem List   Diagnosis Date Noted  . Obesity (BMI 30-39.9) 01/20/2013  . Diabetes mellitus type 2, uncontrolled (HCC) 01/20/2013  . HEMATOCHEZIA 03/30/2009  . 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 HYPERTENSION 11/13/2008  . GERD 11/13/2008    History reviewed. No pertinent surgical history.      Home Medications    Prior to Admission medications   Medication Sig Start Date End Date Taking? Authorizing Provider  amLODipine (NORVASC) 5 MG tablet Take 1 tablet (5 mg total) by mouth daily. 03/18/16   Shaune Pollack, MD  cyclobenzaprine (FLEXERIL) 10 MG tablet Take 1 tablet (10 mg total) by mouth 3 (three) times daily as needed for muscle spasms (and pain). Patient not taking: Reported on 03/18/2016 02/27/15   Trixie Dredge, PA-C  dicyclomine (BENTYL) 20 MG tablet Take 1 tablet (20 mg total) by mouth 4 (four) times daily -  before meals and at bedtime. 03/18/16   Shaune Pollack, MD  ibuprofen (ADVIL,MOTRIN) 600 MG tablet Take 1 tablet (600 mg total) by mouth every 6 (six) hours as needed. 11/13/16   Audry Pili, PA-C  metFORMIN (GLUCOPHAGE) 500 MG tablet Take 1 tablet (500 mg total) by mouth 2 (two) times daily with a meal. 03/18/16 04/17/16  Shaune Pollack, MD    Family History Family History  Problem Relation Age of Onset  . Diabetes Other   . Hypertension Other     Social History Social History   Tobacco Use  .  Smoking status: Never Smoker  . Smokeless tobacco: Never Used  Substance Use Topics  . Alcohol use: Yes    Alcohol/week: 2.4 oz    Types: 4 Cans of beer per week    Comment: 4-6 beers a day  . Drug use: No     Allergies   Patient has no known allergies.   Review of Systems Review of Systems  Constitutional: Negative for chills and fever.  HENT: Negative for rhinorrhea and sore throat.   Eyes: Negative for visual disturbance.  Respiratory: Negative for cough and shortness of breath.   Cardiovascular: Negative for chest  pain and leg swelling.  Gastrointestinal: Negative for abdominal pain, constipation, diarrhea, nausea and vomiting.  Genitourinary: Negative for dysuria and hematuria.  Musculoskeletal: Negative for arthralgias and myalgias.  Skin: Negative for color change.  Allergic/Immunologic: Positive for immunocompromised state (DM2).  Neurological: Positive for headaches. Negative for weakness, light-headedness and numbness.  Psychiatric/Behavioral: Negative for confusion.   All other systems reviewed and are negative for acute change except as noted in the HPI.    Physical Exam Updated Vital Signs BP (!) 151/101 (BP Location: Right Arm)   Pulse (!) 114   Temp 98.2 F (36.8 C) (Oral)   Resp 18   SpO2 97%   Physical Exam  Constitutional: He is oriented to person, place, and time. Vital signs are normal. He appears well-developed and well-nourished.  Non-toxic appearance. No distress.  Afebrile, nontoxic, NAD, BP 154/96 during evaluation which is similar to prior visits (and actually better than visits in 2018)  HENT:  Head: Normocephalic and atraumatic.  Mouth/Throat: Oropharynx is clear and moist and mucous membranes are normal.  Eyes: Pupils are equal, round, and reactive to light. Conjunctivae and EOM are normal. Right eye exhibits no discharge. Left eye exhibits no discharge.  PERRL, EOMI, no nystagmus, no visual field deficits   Neck: Normal range of motion. Neck supple. No spinous process tenderness and no muscular tenderness present. No neck rigidity. Normal range of motion present.  FROM intact without spinous process TTP, no bony stepoffs or deformities, no paraspinous muscle TTP or muscle spasms. No rigidity or meningeal signs. No bruising or swelling.   Cardiovascular: Regular rhythm, normal heart sounds and intact distal pulses. Tachycardia present. Exam reveals no gallop and no friction rub.  No murmur heard. HR 102 during evaluation, similar to prior visits; reg rhythm, nl s1/s2,  no m/r/g, distal pulses intact, no pedal edema  Pulmonary/Chest: Effort normal and breath sounds normal. No respiratory distress. He has no decreased breath sounds. He has no wheezes. He has no rhonchi. He has no rales.  Abdominal: Soft. Normal appearance and bowel sounds are normal. He exhibits no distension. There is no tenderness. There is no rigidity, no rebound, no guarding, no CVA tenderness, no tenderness at McBurney's point and negative Murphy's sign.  Musculoskeletal: Normal range of motion.  MAE x4 Strength and sensation grossly intact in all extremities Distal pulses intact Gait steady  Neurological: He is alert and oriented to person, place, and time. He has normal strength. No cranial nerve deficit or sensory deficit. Coordination and gait normal. GCS eye subscore is 4. GCS verbal subscore is 5. GCS motor subscore is 6.  CN 2-12 grossly intact A&O x4 GCS 15 Sensation and strength intact Gait nonataxic including with tandem walking Coordination with finger-to-nose WNL Neg pronator drift   Skin: Skin is warm, dry and intact. No rash noted.  Psychiatric: He has a normal mood and affect.  Nursing note and vitals reviewed.    ED Treatments / Results  Labs (all labs ordered are listed, but only abnormal results are displayed) Labs Reviewed - No data to display  EKG None  Radiology No results found.  Procedures Procedures (including critical care time)  Medications Ordered in ED Medications - No data to display   Initial Impression / Assessment and Plan / ED Course  I have reviewed the triage vital signs and the nursing notes.  Pertinent labs & imaging results that were available during my care of the patient were reviewed by me and considered in my medical decision making (see chart for details).     47 y.o. male here with HTN and mild HA x2 days. States he's had headaches like this in the past, denies worst headache of his life or thunderclap onset, and states  it's really not bad or bothersome. He stopped taking his lisinopril >68yr ago because of financial issues with f/up with PCP (he could afford the meds, just couldn't afford the PCP visit); was given norvasc last year at an ED visit and never filled it. On exam, no focal neuro deficits, BP 154/96. HR 102 during exam, which appears to be a chronic finding for him based on prior visits. I suspect his headaches are from the HTN, but doubt HTN urgency/emergency. Pt doesn't want anything for his headache because he states it's really not bothersome at all. He just wants to be restarted on his BP meds, which I think is reasonable. Doubt need for labs or CT head/other imaging. Will restart lisinopril  QD, advised avoidance of caffeine and other OTC products that could raise BP; advised DASH diet and adequate hydration. Advised keeping a log of BPs at home to take to his next PCP visit. F/up with PCP in 1wk for recheck of symptoms and ongoing management of his BP. Strict return precautions advised. I explained the diagnosis and have given explicit precautions to return to the ER including for any other new or worsening symptoms. The patient understands and accepts the medical plan as it's been dictated and I have answered their questions. Discharge instructions concerning home care and prescriptions have been given. The patient is STABLE and is discharged to home in good condition.    Final Clinical Impressions(s) / ED Diagnoses   Final diagnoses:  Essential hypertension  Mild headache    ED Discharge Orders        Ordered    lisinopril (PRINIVIL,ZESTRIL) 10 MG tablet  Daily     07/15/17 [address], [place], [place] 07/15/17 2000    Tegeler, Canary Brim, MD 07/15/17 2238

## 2017-07-15 NOTE — Discharge Instructions (Signed)
Your headaches are likely from your blood pressure being elevated. Alternate between tylenol and ibuprofen as needed for pain. Your blood pressure was elevated today. Eat a low salt/low sodium diet, and start taking the blood pressure medication as directed. Keep a log of your blood pressure readings from every morning and evening (making sure to give yourself at least 15 minutes of rest prior to checking it) and take it to your doctor's office at your next appointment for ongoing management of your blood pressure. Stay well hydrated and get plenty of rest. Avoid caffeine and other over the counter products that would make your blood pressure go up (such as decongestants, excedrin, etc). Follow up with your regular doctor in 1 week for recheck of symptoms and ongoing management of your blood pressure. Return to the ER for emergent changes or worsening symptoms.

## 2017-07-15 NOTE — ED Triage Notes (Signed)
Patient here from home with complaints of hypertension. States that he check his BP at [employer]. Does not take any BP meds.

## 2017-08-17 ENCOUNTER — Ambulatory Visit (INDEPENDENT_AMBULATORY_CARE_PROVIDER_SITE_OTHER): Payer: Self-pay | Admitting: Family Medicine

## 2017-08-17 ENCOUNTER — Encounter: Payer: Self-pay | Admitting: Family Medicine

## 2017-08-17 VITALS — BP 110/80 | HR 103 | Temp 98.2°F | Wt 252.0 lb

## 2017-08-17 DIAGNOSIS — B351 Tinea unguium: Secondary | ICD-10-CM

## 2017-08-17 DIAGNOSIS — E1165 Type 2 diabetes mellitus with hyperglycemia: Secondary | ICD-10-CM

## 2017-08-17 DIAGNOSIS — I10 Essential (primary) hypertension: Secondary | ICD-10-CM

## 2017-08-17 LAB — POCT GLYCOSYLATED HEMOGLOBIN (HGB A1C): Hemoglobin A1C: 8.7 % — AB (ref 4.0–5.6)

## 2017-08-17 MED ORDER — METFORMIN HCL 500 MG PO TABS: 500.0000 mg | ORAL_TABLET | Freq: Two times a day (BID) | ORAL | 5 refills | Status: DC

## 2017-08-17 MED ORDER — LISINOPRIL 10 MG PO TABS: 10.0000 mg | ORAL_TABLET | Freq: Every day | ORAL | 5 refills | Status: DC

## 2017-08-17 NOTE — Progress Notes (Signed)
  Subjective:     Patient ID: Peter Mcclure, male   DOB: 04/27/1970, 47 y.o.   MRN: [id number]  HPI Patient is seen today for follow-up after not being seen here in over 3 years. He is divorced and having difficulty with finances and affording office visits and medications. He has type 2 diabetes and has been taking his mother's metformin but only taking one half of 500 mg tablet once daily. Not monitoring blood sugars.  has hypertension and recently went to the ER with elevated blood pressure and was started back on lisinopril. Had previously taken amlodipine as well but currently blood pressure controlled with lisinopril as single drug.  He's had some recent brittle changes involving left ring finger and right middle and fifth finger. Does not have his hands in water very often. No toenail changes.  Past Medical History:  Diagnosis Date  . Hypertension    No past surgical history on file.  reports that he has never smoked. He has never used smokeless tobacco. He reports that he drinks about 2.4 oz of alcohol per week. He reports that he does not use drugs. family history includes Diabetes in his other; Hypertension in his other. No Known Allergies   Review of Systems  Constitutional: Negative for chills, fatigue and unexpected weight change.  Eyes: Negative for visual disturbance.  Respiratory: Negative for cough, chest tightness and shortness of breath.   Cardiovascular: Negative for chest pain, palpitations and leg swelling.  Endocrine: Negative for polydipsia and polyuria.  Neurological: Negative for dizziness, syncope, weakness, light-headedness and headaches.       Objective:   Physical Exam  Constitutional: He is oriented to person, place, and time. He appears well-developed and well-nourished.  HENT:  Right Ear: External ear normal.  Left Ear: External ear normal.  Mouth/Throat: Oropharynx is clear and moist.  Eyes: Pupils are equal, round, and reactive to light.  Neck:  Neck supple. No thyromegaly present.  Cardiovascular: Normal rate and regular rhythm.  Pulmonary/Chest: Effort normal and breath sounds normal. No respiratory distress. He has no wheezes. He has no rales.  Musculoskeletal: He exhibits no edema.  Neurological: He is alert and oriented to person, place, and time.  Skin:  Patient has some brittle nail changes involving left ring and right middle and fifth digits.       Assessment:     #1 type 2 diabetes poorly controlled with poor compliance. Pt is currently basically off medications.  A1c today 8.7%  #2 hypertension stable by today's reading with recent addition back of lisinopril  #3 probable onychomycosis involving several fingernails as above    Plan:     -Star back metformin 500 mg twice daily and reassess A1c within 3 months -Continue lisinopril 10 mg daily with refills given -Patient had questions regarding possible treatments for fingernail. We explained oral medications are best option but at this point is reluctant and does not wish to get any lab work or any additional medications. Discuss at follow-up possible hepatic panel and consideration for Lamisil  Kristian CoveyBruce W Tonae Livolsi MD [facility]

## 2017-09-25 ENCOUNTER — Ambulatory Visit (INDEPENDENT_AMBULATORY_CARE_PROVIDER_SITE_OTHER): Payer: Self-pay | Admitting: Family Medicine

## 2017-09-25 ENCOUNTER — Encounter: Payer: Self-pay | Admitting: Family Medicine

## 2017-09-25 VITALS — BP 120/80 | HR 107 | Temp 98.6°F | Wt 251.6 lb

## 2017-09-25 DIAGNOSIS — I10 Essential (primary) hypertension: Secondary | ICD-10-CM

## 2017-09-25 DIAGNOSIS — M79604 Pain in right leg: Secondary | ICD-10-CM

## 2017-09-25 DIAGNOSIS — M79605 Pain in left leg: Secondary | ICD-10-CM

## 2017-09-25 DIAGNOSIS — G4700 Insomnia, unspecified: Secondary | ICD-10-CM

## 2017-09-25 DIAGNOSIS — E1165 Type 2 diabetes mellitus with hyperglycemia: Secondary | ICD-10-CM

## 2017-09-25 DIAGNOSIS — E785 Hyperlipidemia, unspecified: Secondary | ICD-10-CM

## 2017-09-25 LAB — LIPID PANEL
CHOL/HDL RATIO: 6
CHOLESTEROL: 263 mg/dL — AB (ref 0–200)
HDL: 46.2 mg/dL (ref >39.00)
NONHDL: 216.37
Triglycerides: 247 mg/dL — ABNORMAL HIGH (ref 0.0–149.0)
VLDL: 49.4 mg/dL — ABNORMAL HIGH (ref 0.0–40.0)

## 2017-09-25 LAB — LDL CHOLESTEROL, DIRECT: Direct LDL: 141 mg/dL

## 2017-09-25 NOTE — Patient Instructions (Signed)
Diabetes Mellitus and Nutrition When you have diabetes (diabetes mellitus), it is very important to have healthy eating habits because your blood sugar (glucose) levels are greatly affected by what you eat and drink. Eating healthy foods in the appropriate amounts, at about the same times every day, can help you:  Control your blood glucose.  Lower your risk of heart disease.  Improve your blood pressure.  Reach or maintain a healthy weight.  Every person with diabetes is different, and each person has different needs for a meal plan. Your health care provider may recommend that you work with a diet and nutrition specialist (dietitian) to make a meal plan that is best for you. Your meal plan may vary depending on factors such as:  The calories you need.  The medicines you take.  Your weight.  Your blood glucose, blood pressure, and cholesterol levels.  Your activity level.  Other health conditions you have, such as heart or kidney disease.  How do carbohydrates affect me? Carbohydrates affect your blood glucose level more than any other type of food. Eating carbohydrates naturally increases the amount of glucose in your blood. Carbohydrate counting is a method for keeping track of how many carbohydrates you eat. Counting carbohydrates is important to keep your blood glucose at a healthy level, especially if you use insulin or take certain oral diabetes medicines. It is important to know how many carbohydrates you can safely have in each meal. This is different for every person. Your dietitian can help you calculate how many carbohydrates you should have at each meal and for snack. Foods that contain carbohydrates include:  Bread, cereal, rice, pasta, and crackers.  Potatoes and corn.  Peas, beans, and lentils.  Milk and yogurt.  Fruit and juice.  Desserts, such as cakes, cookies, ice cream, and candy.  How does alcohol affect me? Alcohol can cause a sudden decrease in blood  glucose (hypoglycemia), especially if you use insulin or take certain oral diabetes medicines. Hypoglycemia can be a life-threatening condition. Symptoms of hypoglycemia (sleepiness, dizziness, and confusion) are similar to symptoms of having too much alcohol. If your health care provider says that alcohol is safe for you, follow these guidelines:  Limit alcohol intake to no more than 1 drink per day for nonpregnant women and 2 drinks per day for men. One drink equals 12 oz of beer, 5 oz of wine, or 1 oz of hard liquor.  Do not drink on an empty stomach.  Keep yourself hydrated with water, diet soda, or unsweetened iced tea.  Keep in mind that regular soda, juice, and other mixers may contain a lot of sugar and must be counted as carbohydrates.  What are tips for following this plan? Reading food labels  Start by checking the serving size on the label. The amount of calories, carbohydrates, fats, and other nutrients listed on the label are based on one serving of the food. Many foods contain more than one serving per package.  Check the total grams (g) of carbohydrates in one serving. You can calculate the number of servings of carbohydrates in one serving by dividing the total carbohydrates by 15. For example, if a food has 30 g of total carbohydrates, it would be equal to 2 servings of carbohydrates.  Check the number of grams (g) of saturated and trans fats in one serving. Choose foods that have low or no amount of these fats.  Check the number of milligrams (mg) of sodium in one serving. Most people   should limit total sodium intake to less than 2,300 mg per day.  Always check the nutrition information of foods labeled as "low-fat" or "nonfat". These foods may be higher in added sugar or refined carbohydrates and should be avoided.  Talk to your dietitian to identify your daily goals for nutrients listed on the label. Shopping  Avoid buying canned, premade, or processed foods. These  foods tend to be high in fat, sodium, and added sugar.  Shop around the outside edge of the grocery store. This includes fresh fruits and vegetables, bulk grains, fresh meats, and fresh dairy. Cooking  Use low-heat cooking methods, such as baking, instead of high-heat cooking methods like deep frying.  Cook using healthy oils, such as olive, canola, or sunflower oil.  Avoid cooking with butter, cream, or high-fat meats. Meal planning  Eat meals and snacks regularly, preferably at the same times every day. Avoid going long periods of time without eating.  Eat foods high in fiber, such as fresh fruits, vegetables, beans, and whole grains. Talk to your dietitian about how many servings of carbohydrates you can eat at each meal.  Eat 4-6 ounces of lean protein each day, such as lean meat, chicken, fish, eggs, or tofu. 1 ounce is equal to 1 ounce of meat, chicken, or fish, 1 egg, or 1/4 cup of tofu.  Eat some foods each day that contain healthy fats, such as avocado, nuts, seeds, and fish. Lifestyle   Check your blood glucose regularly.  Exercise at least 30 minutes 5 or more days each week, or as told by your health care provider.  Take medicines as told by your health care provider.  Do not use any products that contain nicotine or tobacco, such as cigarettes and e-cigarettes. If you need help quitting, ask your health care provider.  Work with a Veterinary surgeon or diabetes educator to identify strategies to manage stress and any emotional and social challenges. What are some questions to ask my health care provider?  Do I need to meet with a diabetes educator?  Do I need to meet with a dietitian?  What number can I call if I have questions?  When are the best times to check my blood glucose? Where to find more information:  American Diabetes Association: diabetes.org/food-and-fitness/food  Academy of Nutrition and Dietetics:  https://www.vargas.com/  [employer] of Diabetes and Digestive and Kidney Diseases (NIH): FindJewelers.cz Summary  A healthy meal plan will help you control your blood glucose and maintain a healthy lifestyle.  Working with a diet and nutrition specialist (dietitian) can help you make a meal plan that is best for you.  Keep in mind that carbohydrates and alcohol have immediate effects on your blood glucose levels. It is important to count carbohydrates and to use alcohol carefully. This information is not intended to replace advice given to you by your health care provider. Make sure you discuss any questions you have with your health care provider. Document Released: 11/24/2004 Document Revised: 04/03/2016 Document Reviewed: 04/03/2016 Elsevier Interactive Patient Education  2018 [employer]. Insomnia Insomnia is a sleep disorder that makes it difficult to fall asleep or to stay asleep. Insomnia can cause tiredness (fatigue), low energy, difficulty concentrating, mood swings, and poor performance at work or school. There are three different ways to classify insomnia:  Difficulty falling asleep.  Difficulty staying asleep.  Waking up too early in the morning.  Any type of insomnia can be long-term (chronic) or short-term (acute). Both are common. Short-term insomnia usually  lasts for three months or less. Chronic insomnia occurs at least three times a week for longer than three months. What are the causes? Insomnia may be caused by another condition, situation, or substance, such as:  Anxiety.  Certain medicines.  Gastroesophageal reflux disease (GERD) or other gastrointestinal conditions.  Asthma or other breathing conditions.  Restless legs syndrome, sleep apnea, or other sleep disorders.  Chronic pain.  Menopause. This may include hot  flashes.  Stroke.  Abuse of alcohol, tobacco, or illegal drugs.  Depression.  Caffeine.  Neurological disorders, such as Alzheimer disease.  An overactive thyroid (hyperthyroidism).  The cause of insomnia may not be known. What increases the risk? Risk factors for insomnia include:  Gender. Women are more commonly affected than men.  Age. Insomnia is more common as you get older.  Stress. This may involve your professional or personal life.  Income. Insomnia is more common in people with lower income.  Lack of exercise.  Irregular work schedule or night shifts.  Traveling between different time zones.  What are the signs or symptoms? If you have insomnia, trouble falling asleep or trouble staying asleep is the main symptom. This may lead to other symptoms, such as:  Feeling fatigued.  Feeling nervous about going to sleep.  Not feeling rested in the morning.  Having trouble concentrating.  Feeling irritable, anxious, or depressed.  How is this treated? Treatment for insomnia depends on the cause. If your insomnia is caused by an underlying condition, treatment will focus on addressing the condition. Treatment may also include:  Medicines to help you sleep.  Counseling or therapy.  Lifestyle adjustments.  Follow these instructions at home:  Take medicines only as directed by your health care provider.  Keep regular sleeping and waking hours. Avoid naps.  Keep a sleep diary to help you and your health care provider figure out what could be causing your insomnia. Include: ? When you sleep. ? When you wake up during the night. ? How well you sleep. ? How rested you feel the next day. ? Any side effects of medicines you are taking. ? What you eat and drink.  Make your bedroom a comfortable place where it is easy to fall asleep: ? Put up shades or special blackout curtains to block light from outside. ? Use a white noise machine to block noise. ? Keep  the temperature cool.  Exercise regularly as directed by your health care provider. Avoid exercising right before bedtime.  Use relaxation techniques to manage stress. Ask your health care provider to suggest some techniques that may work well for you. These may include: ? Breathing exercises. ? Routines to release muscle tension. ? Visualizing peaceful scenes.  Cut back on alcohol, caffeinated beverages, and cigarettes, especially close to bedtime. These can disrupt your sleep.  Do not overeat or eat spicy foods right before bedtime. This can lead to digestive discomfort that can make it hard for you to sleep.  Limit screen use before bedtime. This includes: ? Watching TV. ? Using your smartphone, tablet, and computer.  Stick to a routine. This can help you fall asleep faster. Try to do a quiet activity, brush your teeth, and go to bed at the same time each night.  Get out of bed if you are still awake after 15 minutes of trying to sleep. Keep the lights down, but try reading or doing a quiet activity. When you feel sleepy, go back to bed.  Make sure that you drive carefully.  Avoid driving if you feel very sleepy.  Keep all follow-up appointments as directed by your health care provider. This is important. Contact a health care provider if:  You are tired throughout the day or have trouble in your daily routine due to sleepiness.  You continue to have sleep problems or your sleep problems get worse. Get help right away if:  You have serious thoughts about hurting yourself or someone else. This information is not intended to replace advice given to you by your health care provider. Make sure you discuss any questions you have with your health care provider. Document Released: 02/25/2000 Document Revised: 07/30/2015 Document Reviewed: 11/28/2013 Elsevier Interactive Patient Education  [employer]2018 Elsevier Inc.

## 2017-09-25 NOTE — Progress Notes (Signed)
  Subjective:     Patient ID: Peter Mcclure, male   DOB: 05/30/1970, 47 y.o.   MRN: [id number]  HPI Patient was in recently with type 2 diabetes out of control. We started back metformin. 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 We discussed the following issues today  Type 2 diabetes. Recent A1c 8.7. We got him back on metformin. He's having no significant polyuria. Does have some increased thirst at night. He does not have home glucose monitor currently. We have planned follow-up in September to recheck A1c.  Patient requesting lipids. These were done several years ago. Had elevated triglycerides then.  Hypertension treated with lisinopril 10 mg daily. Also takes amlodipine. No headaches. No chest pains. No dizziness. He does have some general fatigue issues. No daytime somnolence. Has difficulty staying asleep.  Intermittent bilateral fleeting sharp leg pain. Not related to movement or activity.  Past Medical History:  Diagnosis Date  . Hypertension    No past surgical history on file.  reports that he has never smoked. He has never used smokeless tobacco. He reports that he drinks about 2.4 oz of alcohol per week. He reports that he does not use drugs. family history includes Diabetes in his other; Hypertension in his other. No Known Allergies   Review of Systems  Constitutional: Positive for fatigue.  Eyes: Negative for visual disturbance.  Respiratory: Negative for cough, chest tightness and shortness of breath.   Cardiovascular: Negative for chest pain, palpitations and leg swelling.  Genitourinary: Negative for dysuria.  Neurological: Negative for dizziness, syncope, weakness, light-headedness, numbness and headaches.       Objective:   Physical Exam  Constitutional: He is oriented to person, place, and time. He appears well-developed and well-nourished.  HENT:  Right Ear: External ear normal.  Left Ear: External ear normal.  Mouth/Throat: Oropharynx  is clear and moist.  Eyes: Pupils are equal, round, and reactive to light.  Neck: Neck supple. No thyromegaly present.  Cardiovascular: Normal rate and regular rhythm.  Feet are warm. Excellent capillary refill. 2+ dorsalis pedis pulse bilaterally  Pulmonary/Chest: Effort normal and breath sounds normal. No respiratory distress. He has no wheezes. He has no rales.  Musculoskeletal: He exhibits no edema.  Neurological: He is alert and oriented to person, place, and time.  Full-strength lower extremities with symmetric reflexes       Assessment:     #1 type 2 diabetes with poor control and recent initiation back on metformin  #2 hypertension stable and at goal  #3 history of dyslipidemia  #4 general fatigue probably related to insomnia  #5 fleeting bilateral leg pain. Question neuropathic    Plan:     -Keep follow-up in September to reassess A1c -Home glucose monitor given and nurse instructed in use -Consider trial of melatonin for his insomnia. Handout on insomnia given -Hopefully his leg pains with improve as diabetic control improves -Check fasting lipid panel -We encouraged him to start getting more engaged in regular exercise with ideally combination of aerobic and resistance training  Kristian CoveyBruce W Jaeden Messer MD [facility]

## 2017-09-26 ENCOUNTER — Other Ambulatory Visit: Payer: Self-pay | Admitting: Family Medicine

## 2017-09-26 MED ORDER — ATORVASTATIN CALCIUM 20 MG PO TABS: 20.0000 mg | ORAL_TABLET | Freq: Every day | ORAL | 2 refills | Status: DC

## 2017-11-16 ENCOUNTER — Ambulatory Visit (INDEPENDENT_AMBULATORY_CARE_PROVIDER_SITE_OTHER): Payer: Self-pay | Admitting: Family Medicine

## 2017-11-16 ENCOUNTER — Encounter: Payer: Self-pay | Admitting: Family Medicine

## 2017-11-16 DIAGNOSIS — E1165 Type 2 diabetes mellitus with hyperglycemia: Secondary | ICD-10-CM

## 2017-11-16 LAB — POCT GLYCOSYLATED HEMOGLOBIN (HGB A1C): HEMOGLOBIN A1C: 9.4 % — AB (ref 4.0–5.6)

## 2017-11-16 MED ORDER — METFORMIN HCL 500 MG PO TABS: ORAL_TABLET | ORAL | 3 refills | Status: DC

## 2017-11-16 NOTE — Progress Notes (Signed)
  Subjective:     Patient ID: Peter Mcclure, male   DOB: 03-29-1970, 47 y.o.   MRN: [id number]  HPI Follow-up type 2 diabetes. Basically, he has made no changes in lifestyle since last visit. Not exercising regular. Consuming lots of high glycemic foods in terms of beverages and foods. Eats lots of potatoes and chips and snack foods. Frequently drinks Gatorade or colas. Not monitoring blood sugars. No polyuria or polydipsia. He states he is compliant with blood pressure and cholesterol medication. Last A1c 8.4%. Currently on metformin 500 mg twice a day. No GI side effects.  Past Medical History:  Diagnosis Date  . Hypertension    No past surgical history on file.  reports that he has never smoked. He has never used smokeless tobacco. He reports that he drinks about 4.0 standard drinks of alcohol per week. He reports that he does not use drugs. family history includes Diabetes in his other; Hypertension in his other. No Known Allergies   Review of Systems  Constitutional: Negative for fatigue and unexpected weight change.  Eyes: Negative for visual disturbance.  Respiratory: Negative for cough, chest tightness and shortness of breath.   Cardiovascular: Negative for chest pain, palpitations and leg swelling.  Endocrine: Negative for polydipsia and polyuria.  Neurological: Negative for dizziness, syncope, weakness, light-headedness and headaches.       Objective:   Physical Exam  Constitutional: He is oriented to person, place, and time. He appears well-developed and well-nourished.  HENT:  Right Ear: External ear normal.  Left Ear: External ear normal.  Mouth/Throat: Oropharynx is clear and moist.  Eyes: Pupils are equal, round, and reactive to light.  Neck: Neck supple. No thyromegaly present.  Cardiovascular: Normal rate and regular rhythm.  Pulmonary/Chest: Effort normal and breath sounds normal. No respiratory distress. He has no wheezes. He has no rales.  Musculoskeletal: He  exhibits no edema.  Neurological: He is alert and oriented to person, place, and time.       Assessment:     Type 2 diabetes poorly controlled with A1c 9.4%.  Poor compliance with diet and exercise.  He is aware of potential long-term complications with poorly controlled diabetes    Plan:     -increase metformin to 500 mg to  2 twice daily -Discussed diet in some detail. He would benefit from nutrition counseling but currently has no insurance and he declines. Handout given -Reduce high glycemic foods and white starches -Increase exercise -Three-month follow-up. If A1c not further to go that point consider additional medication  [name] MD [facility] at [facility]

## 2017-11-16 NOTE — Patient Instructions (Signed)
Diabetes Mellitus and Nutrition When you have diabetes (diabetes mellitus), it is very important to have healthy eating habits because your blood sugar (glucose) levels are greatly affected by what you eat and drink. Eating healthy foods in the appropriate amounts, at about the same times every day, can help you:  Control your blood glucose.  Lower your risk of heart disease.  Improve your blood pressure.  Reach or maintain a healthy weight.  Every person with diabetes is different, and each person has different needs for a meal plan. Your health care provider may recommend that you work with a diet and nutrition specialist (dietitian) to make a meal plan that is best for you. Your meal plan may vary depending on factors such as:  The calories you need.  The medicines you take.  Your weight.  Your blood glucose, blood pressure, and cholesterol levels.  Your activity level.  Other health conditions you have, such as heart or kidney disease.  How do carbohydrates affect me? Carbohydrates affect your blood glucose level more than any other type of food. Eating carbohydrates naturally increases the amount of glucose in your blood. Carbohydrate counting is a method for keeping track of how many carbohydrates you eat. Counting carbohydrates is important to keep your blood glucose at a healthy level, especially if you use insulin or take certain oral diabetes medicines. It is important to know how many carbohydrates you can safely have in each meal. This is different for every person. Your dietitian can help you calculate how many carbohydrates you should have at each meal and for snack. Foods that contain carbohydrates include:  Bread, cereal, rice, pasta, and crackers.  Potatoes and corn.  Peas, beans, and lentils.  Milk and yogurt.  Fruit and juice.  Desserts, such as cakes, cookies, ice cream, and candy.  How does alcohol affect me? Alcohol can cause a sudden decrease in blood  glucose (hypoglycemia), especially if you use insulin or take certain oral diabetes medicines. Hypoglycemia can be a life-threatening condition. Symptoms of hypoglycemia (sleepiness, dizziness, and confusion) are similar to symptoms of having too much alcohol. If your health care provider says that alcohol is safe for you, follow these guidelines:  Limit alcohol intake to no more than 1 drink per day for nonpregnant women and 2 drinks per day for men. One drink equals 12 oz of beer, 5 oz of wine, or 1 oz of hard liquor.  Do not drink on an empty stomach.  Keep yourself hydrated with water, diet soda, or unsweetened iced tea.  Keep in mind that regular soda, juice, and other mixers may contain a lot of sugar and must be counted as carbohydrates.  What are tips for following this plan? Reading food labels  Start by checking the serving size on the label. The amount of calories, carbohydrates, fats, and other nutrients listed on the label are based on one serving of the food. Many foods contain more than one serving per package.  Check the total grams (g) of carbohydrates in one serving. You can calculate the number of servings of carbohydrates in one serving by dividing the total carbohydrates by 15. For example, if a food has 30 g of total carbohydrates, it would be equal to 2 servings of carbohydrates.  Check the number of grams (g) of saturated and trans fats in one serving. Choose foods that have low or no amount of these fats.  Check the number of milligrams (mg) of sodium in one serving. Most people   should limit total sodium intake to less than 2,300 mg per day.  Always check the nutrition information of foods labeled as "low-fat" or "nonfat". These foods may be higher in added sugar or refined carbohydrates and should be avoided.  Talk to your dietitian to identify your daily goals for nutrients listed on the label. Shopping  Avoid buying canned, premade, or processed foods. These  foods tend to be high in fat, sodium, and added sugar.  Shop around the outside edge of the grocery store. This includes fresh fruits and vegetables, bulk grains, fresh meats, and fresh dairy. Cooking  Use low-heat cooking methods, such as baking, instead of high-heat cooking methods like deep frying.  Cook using healthy oils, such as olive, canola, or sunflower oil.  Avoid cooking with butter, cream, or high-fat meats. Meal planning  Eat meals and snacks regularly, preferably at the same times every day. Avoid going long periods of time without eating.  Eat foods high in fiber, such as fresh fruits, vegetables, beans, and whole grains. Talk to your dietitian about how many servings of carbohydrates you can eat at each meal.  Eat 4-6 ounces of lean protein each day, such as lean meat, chicken, fish, eggs, or tofu. 1 ounce is equal to 1 ounce of meat, chicken, or fish, 1 egg, or 1/4 cup of tofu.  Eat some foods each day that contain healthy fats, such as avocado, nuts, seeds, and fish. Lifestyle   Check your blood glucose regularly.  Exercise at least 30 minutes 5 or more days each week, or as told by your health care provider.  Take medicines as told by your health care provider.  Do not use any products that contain nicotine or tobacco, such as cigarettes and e-cigarettes. If you need help quitting, ask your health care provider.  Work with a counselor or diabetes educator to identify strategies to manage stress and any emotional and social challenges. What are some questions to ask my health care provider?  Do I need to meet with a diabetes educator?  Do I need to meet with a dietitian?  What number can I call if I have questions?  When are the best times to check my blood glucose? Where to find more information:  American Diabetes Association: diabetes.org/food-and-fitness/food  Academy of Nutrition and Dietetics:  www.eatright.org/resources/health/diseases-and-conditions/diabetes  National Institute of Diabetes and Digestive and Kidney Diseases (NIH): www.niddk.nih.gov/health-information/diabetes/overview/diet-eating-physical-activity Summary  A healthy meal plan will help you control your blood glucose and maintain a healthy lifestyle.  Working with a diet and nutrition specialist (dietitian) can help you make a meal plan that is best for you.  Keep in mind that carbohydrates and alcohol have immediate effects on your blood glucose levels. It is important to count carbohydrates and to use alcohol carefully. This information is not intended to replace advice given to you by your health care provider. Make sure you discuss any questions you have with your health care provider. Document Released: 11/24/2004 Document Revised: 04/03/2016 Document Reviewed: 04/03/2016 Elsevier Interactive Patient Education  2018 Else[employer] Inc.  

## 2018-01-20 ENCOUNTER — Other Ambulatory Visit: Payer: Self-pay | Admitting: Family Medicine

## 2018-02-18 ENCOUNTER — Ambulatory Visit: Payer: Self-pay | Admitting: Family Medicine

## 2018-03-02 ENCOUNTER — Other Ambulatory Visit: Payer: Self-pay | Admitting: Family Medicine

## 2018-03-15 ENCOUNTER — Ambulatory Visit: Payer: Self-pay | Admitting: Family Medicine

## 2018-03-15 DIAGNOSIS — Z0289 Encounter for other administrative examinations: Secondary | ICD-10-CM

## 2018-04-25 ENCOUNTER — Ambulatory Visit (HOSPITAL_COMMUNITY)
Admission: EM | Admit: 2018-04-25 | Discharge: 2018-04-25 | Disposition: A | Discharge status: Home / Self Care | Payer: BLUE CROSS/BLUE SHIELD | Attending: Family Medicine | Admitting: Family Medicine

## 2018-04-25 ENCOUNTER — Encounter (HOSPITAL_COMMUNITY): Payer: Self-pay | Admitting: Emergency Medicine

## 2018-04-25 DIAGNOSIS — R109 Unspecified abdominal pain: Secondary | ICD-10-CM

## 2018-04-25 HISTORY — DX: Type 2 diabetes mellitus without complications: E11.9

## 2018-04-25 LAB — POCT URINALYSIS DIP (DEVICE)
Bilirubin Urine: NEGATIVE
GLUCOSE, UA: 500 mg/dL — AB
Hgb urine dipstick: NEGATIVE
LEUKOCYTE UA: NEGATIVE
Nitrite: NEGATIVE
PROTEIN: NEGATIVE mg/dL
Specific Gravity, Urine: >=1.03 (ref 1.005–1.030)
UROBILINOGEN UA: 1 mg/dL (ref 0.0–1.0)
pH: 5.5 (ref 5.0–8.0)

## 2018-04-25 LAB — GLUCOSE, CAPILLARY: GLUCOSE-CAPILLARY: 197 mg/dL — AB (ref 70–99)

## 2018-04-25 MED ORDER — CYCLOBENZAPRINE HCL 5 MG PO TABS: 5.0000 mg | ORAL_TABLET | Freq: Every day | ORAL | 0 refills | Status: DC

## 2018-04-25 NOTE — ED Provider Notes (Signed)
[facility]    CSN: [id number] Arrival date & time: 04/25/18  1811     History   Chief Complaint Chief Complaint  Patient presents with  . Flank Pain    HPI Peter Mcclure is a 48 y.o. male.   48 year old man with flank pain, making an initial visit to urgent care on [address].     Past Medical History:  Diagnosis Date  . Diabetes mellitus without complication (HCC)   . Hypertension     Patient Active Problem List   Diagnosis Date Noted  . Obesity (BMI 30-39.9) 01/20/2013  . Diabetes mellitus type 2, uncontrolled (HCC) 01/20/2013  . HEMATOCHEZIA 03/30/2009  . 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 Hypertension 11/13/2008  . GERD 11/13/2008    History reviewed. No pertinent surgical history.     Home Medications    Prior to Admission medications   Medication Sig Start Date End Date Taking? Authorizing Provider  atorvastatin (LIPITOR) 20 MG tablet TAKE 1 TABLET BY MOUTH ONCE DAILY 01/21/18   Burchette, Elberta Fortis, MD  cyclobenzaprine (FLEXERIL) 5 MG tablet Take 1 tablet (5 mg total) by mouth at bedtime. 04/25/18   Elvina Sidle, MD  ibuprofen (ADVIL,MOTRIN) 600 MG tablet Take 1 tablet (600 mg total) by mouth every 6 (six) hours as needed. 11/13/16   Audry Pili, PA-C  lisinopril (PRINIVIL,ZESTRIL) 10 MG tablet TAKE 1 TABLET BY MOUTH ONCE DAILY 03/04/18   Burchette, Elberta Fortis, MD  metFORMIN (GLUCOPHAGE) 500 MG tablet Take two tablets twice daily. 11/16/17   Burchette, Elberta Fortis, MD    Family History Family History  Problem Relation Age of Onset  . Diabetes Other   . Hypertension Other     Social History Social History   Tobacco Use  . Smoking status: Never Smoker  . Smokeless tobacco: Never Used  Substance Use Topics  . Alcohol use: Yes    Alcohol/week: 4.0 standard drinks    Types: 4 Cans of beer per week    Comment: 4-6 beers a day  . Drug use: No     Allergies   Patient has no known allergies.   Review of Systems Review of  Systems   Physical Exam Triage Vital Signs ED Triage Vitals  Enc Vitals Group     BP 04/25/18 1835 (!) 158/95     Pulse Rate 04/25/18 1833 99     Resp 04/25/18 1833 18     Temp 04/25/18 1833 97.7 F (36.5 C)     Temp src --      SpO2 04/25/18 1833 99 %     Weight --      Height --      Head Circumference --      Peak Flow --      Pain Score 04/25/18 1836 0     Pain Loc --      Pain Edu? --      Excl. in GC? --    No data found.  Updated Vital Signs BP (!) 158/95   Pulse 99   Temp 97.7 F (36.5 C)   Resp 18   SpO2 99%    Physical Exam Vitals signs reviewed.  Constitutional:      Appearance: Normal appearance. He is obese.  HENT:     Head: Normocephalic.     Nose: Nose normal.  Eyes:     Conjunctiva/sclera: Conjunctivae normal.  Cardiovascular:     Rate and Rhythm: Normal rate.  Pulmonary:  Effort: Pulmonary effort is normal.     Breath sounds: Normal breath sounds.  Skin:    General: Skin is warm and dry.  Neurological:     General: No focal deficit present.     Mental Status: He is alert.  Psychiatric:        Mood and Affect: Mood normal.      UC Treatments / Results  Labs (all labs ordered are listed, but only abnormal results are displayed) Labs Reviewed  GLUCOSE, CAPILLARY - Abnormal; Notable for the following components:      Result Value   Glucose-Capillary 197 (*)    All other components within normal limits  POCT URINALYSIS DIP (DEVICE) - Abnormal; Notable for the following components:   Glucose, UA 500 (*)    Ketones, ur TRACE (*)    All other components within normal limits  CBG MONITORING, ED    EKG None  Radiology No results found.  Procedures Procedures (including critical care time)  Medications Ordered in UC Medications - No data to display  Initial Impression / Assessment and Plan / UC Course  I have reviewed the triage vital signs and the nursing notes.  Pertinent labs & imaging results that were available  during my care of the patient were reviewed by me and considered in my medical decision making (see chart for details).    Final Clinical Impressions(s) / UC Diagnoses   Final diagnoses:  Right flank pain     Discharge Instructions     Take your daily medicine    ED Prescriptions    Medication Sig Dispense Auth. Provider   cyclobenzaprine (FLEXERIL) 5 MG tablet Take 1 tablet (5 mg total) by mouth at bedtime. 7 tablet Elvina Sidle, MD     Controlled Substance Prescriptions [place] Controlled Substance Registry consulted? Not Applicable   Elvina Sidle, MD 04/25/18 1925

## 2018-04-25 NOTE — ED Triage Notes (Signed)
Pt c/o R flank pain started this morning. States when he turns to the right it hurts a little bit. Denies issues with urination.

## 2018-04-25 NOTE — Discharge Instructions (Signed)
Take your daily medicine

## 2018-05-13 ENCOUNTER — Other Ambulatory Visit: Payer: Self-pay | Admitting: Family Medicine

## 2018-05-24 ENCOUNTER — Other Ambulatory Visit: Payer: Self-pay | Admitting: Family Medicine

## 2018-06-18 ENCOUNTER — Encounter (HOSPITAL_COMMUNITY): Payer: Self-pay | Admitting: Emergency Medicine

## 2018-06-18 ENCOUNTER — Telehealth: Payer: Self-pay | Admitting: *Deleted

## 2018-06-18 ENCOUNTER — Other Ambulatory Visit: Payer: Self-pay

## 2018-06-18 ENCOUNTER — Emergency Department (HOSPITAL_COMMUNITY): Payer: BLUE CROSS/BLUE SHIELD

## 2018-06-18 ENCOUNTER — Emergency Department (HOSPITAL_COMMUNITY)
Admission: EM | Admit: 2018-06-18 | Discharge: 2018-06-18 | Disposition: A | Discharge status: Home / Self Care | Payer: BLUE CROSS/BLUE SHIELD | Attending: Emergency Medicine | Admitting: Emergency Medicine

## 2018-06-18 DIAGNOSIS — I10 Essential (primary) hypertension: Secondary | ICD-10-CM | POA: Diagnosis not present

## 2018-06-18 DIAGNOSIS — R0981 Nasal congestion: Secondary | ICD-10-CM | POA: Insufficient documentation

## 2018-06-18 DIAGNOSIS — R000 Tachycardia, unspecified: Secondary | ICD-10-CM

## 2018-06-18 DIAGNOSIS — Z7984 Long term (current) use of oral hypoglycemic drugs: Secondary | ICD-10-CM | POA: Insufficient documentation

## 2018-06-18 DIAGNOSIS — R05 Cough: Secondary | ICD-10-CM | POA: Insufficient documentation

## 2018-06-18 DIAGNOSIS — E119 Type 2 diabetes mellitus without complications: Secondary | ICD-10-CM | POA: Diagnosis not present

## 2018-06-18 DIAGNOSIS — R002 Palpitations: Secondary | ICD-10-CM | POA: Diagnosis present

## 2018-06-18 DIAGNOSIS — J3489 Other specified disorders of nose and nasal sinuses: Secondary | ICD-10-CM | POA: Insufficient documentation

## 2018-06-18 DIAGNOSIS — Z79899 Other long term (current) drug therapy: Secondary | ICD-10-CM | POA: Insufficient documentation

## 2018-06-18 DIAGNOSIS — E860 Dehydration: Secondary | ICD-10-CM | POA: Insufficient documentation

## 2018-06-18 LAB — COMPREHENSIVE METABOLIC PANEL
ALT: 40 U/L (ref 0–44)
AST: 28 U/L (ref 15–41)
Albumin: 4.2 g/dL (ref 3.5–5.0)
Alkaline Phosphatase: 100 U/L (ref 38–126)
Anion gap: 17 — ABNORMAL HIGH (ref 5–15)
BUN: 9 mg/dL (ref 6–20)
CO2: 15 mmol/L — ABNORMAL LOW (ref 22–32)
Calcium: 8.8 mg/dL — ABNORMAL LOW (ref 8.9–10.3)
Chloride: 104 mmol/L (ref 98–111)
Creatinine, Ser: 0.71 mg/dL (ref 0.61–1.24)
GFR calc Af Amer: >60 mL/min (ref >60)
GFR calc non Af Amer: >60 mL/min (ref >60)
Glucose, Bld: 166 mg/dL — ABNORMAL HIGH (ref 70–99)
Potassium: 3.5 mmol/L (ref 3.5–5.1)
Sodium: 136 mmol/L (ref 135–145)
Total Bilirubin: 0.5 mg/dL (ref 0.3–1.2)
Total Protein: 7.9 g/dL (ref 6.5–8.1)

## 2018-06-18 LAB — CBC WITH DIFFERENTIAL/PLATELET
Abs Immature Granulocytes: 0.03 10*3/uL (ref 0.00–0.07)
Basophils Absolute: 0 10*3/uL (ref 0.0–0.1)
Basophils Relative: 1 %
Eosinophils Absolute: 0.1 10*3/uL (ref 0.0–0.5)
Eosinophils Relative: 2 %
HCT: 40 % (ref 39.0–52.0)
Hemoglobin: 13.2 g/dL (ref 13.0–17.0)
Immature Granulocytes: 0 %
Lymphocytes Relative: 30 %
Lymphs Abs: 2.1 10*3/uL (ref 0.7–4.0)
MCH: 31.7 pg (ref 26.0–34.0)
MCHC: 33 g/dL (ref 30.0–36.0)
MCV: 96.2 fL (ref 80.0–100.0)
Monocytes Absolute: 0.7 10*3/uL (ref 0.1–1.0)
Monocytes Relative: 10 %
Neutro Abs: 4 10*3/uL (ref 1.7–7.7)
Neutrophils Relative %: 57 %
Platelets: 256 10*3/uL (ref 150–400)
RBC: 4.16 MIL/uL — ABNORMAL LOW (ref 4.22–5.81)
RDW: 11.1 % — ABNORMAL LOW (ref 11.5–15.5)
WBC: 7 10*3/uL (ref 4.0–10.5)
nRBC: 0 % (ref 0.0–0.2)

## 2018-06-18 LAB — CK: Total CK: 247 U/L (ref 49–397)

## 2018-06-18 LAB — TSH: TSH: 3.116 u[IU]/mL (ref 0.350–4.500)

## 2018-06-18 LAB — D-DIMER, QUANTITATIVE: D-Dimer, Quant: 0.3 ug/mL-FEU (ref 0.00–0.50)

## 2018-06-18 LAB — MAGNESIUM: Magnesium: 1.6 mg/dL — ABNORMAL LOW (ref 1.7–2.4)

## 2018-06-18 LAB — TROPONIN I: Troponin I: <0.03 ng/mL (ref <0.03)

## 2018-06-18 MED ORDER — SODIUM CHLORIDE 0.9 % IV BOLUS
1000.0000 mL | Freq: Once | INTRAVENOUS | Status: AC
  Administered 2018-06-18: 01:00:00 999 mL via INTRAVENOUS

## 2018-06-18 NOTE — Discharge Instructions (Signed)
Your work-up today was overall reassuring.  Your heart rate improved with fluids and we suspect there is a component of dehydration.  As we discussed, please work on your alcohol use as this may have contributed as well.  Your blood test was negative for blood clot as the cause and your cardiac enzyme was negative.  Your other labs were overall reassuring.  Please follow-up with your primary doctor and return if any symptoms change or worsen.

## 2018-06-18 NOTE — ED Triage Notes (Signed)
Pt reports having palpitations for the last week worsening today.

## 2018-06-18 NOTE — Telephone Encounter (Signed)
Copied from CRM [id number]. Topic: Appointment Scheduling - Scheduling Inquiry for Clinic >> Jun 18, 2018  4:10 PM Lynne Logan D wrote: Reason for CRM: Pt called to schedule appointment with Dr. Caryl Never. Please advise.

## 2018-06-18 NOTE — ED Provider Notes (Signed)
[facility]-EMERGENCY DEPT Provider Note   CSN: [id number] Arrival date & time: 06/18/18  0001    History   Chief Complaint Chief Complaint  Patient presents with  . Palpitations    HPI Peter Mcclure is a 48 y.o. male.     The history is provided by the patient and medical records. No language interpreter was used.  Palpitations  Palpitations quality:  Fast Onset quality:  Gradual Duration:  1 week Timing:  Constant Progression:  Waxing and waning Chronicity:  Recurrent Context: dehydration and exercise   Context: not anxiety and not caffeine   Relieved by:  Nothing Worsened by:  Nothing Ineffective treatments:  None tried Associated symptoms: cough   Associated symptoms: no back pain, no chest pain, no chest pressure, no diaphoresis, no dizziness, no hemoptysis, no leg pain, no lower extremity edema, no malaise/fatigue, no nausea, no near-syncope, no numbness, no shortness of breath, no syncope, no vomiting and no weakness   Risk factors: diabetes mellitus   Risk factors: no hx of atrial fibrillation, no hx of DVT, no hx of PE, no hx of thyroid disease and no hyperthyroidism     Past Medical History:  Diagnosis Date  . Diabetes mellitus without complication (HCC)   . Hypertension     Patient Active Problem List   Diagnosis Date Noted  . Obesity (BMI 30-39.9) 01/20/2013  . Diabetes mellitus type 2, uncontrolled (HCC) 01/20/2013  . HEMATOCHEZIA 03/30/2009  . 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 Hypertension 11/13/2008  . GERD 11/13/2008    History reviewed. No pertinent surgical history.      Home Medications    Prior to Admission medications   Medication Sig Start Date End Date Taking? Authorizing Provider  atorvastatin (LIPITOR) 20 MG tablet Take 1 tablet by mouth once daily 05/14/18   Burchette, Elberta Fortis, MD  cyclobenzaprine (FLEXERIL) 5 MG tablet Take 1 tablet (5 mg total) by mouth at bedtime. 04/25/18   Elvina Sidle, MD   ibuprofen (ADVIL,MOTRIN) 600 MG tablet Take 1 tablet (600 mg total) by mouth every 6 (six) hours as needed. 11/13/16   Audry Pili, PA-C  lisinopril (PRINIVIL,ZESTRIL) 10 MG tablet Take 1 tablet by mouth once daily 05/24/18   Burchette, Elberta Fortis, MD  metFORMIN (GLUCOPHAGE) 500 MG tablet Take two tablets twice daily. 11/16/17   Burchette, Elberta Fortis, MD    Family History Family History  Problem Relation Age of Onset  . Diabetes Other   . Hypertension Other     Social History Social History   Tobacco Use  . Smoking status: Never Smoker  . Smokeless tobacco: Never Used  Substance Use Topics  . Alcohol use: Yes    Alcohol/week: 4.0 standard drinks    Types: 4 Cans of beer per week    Comment: 4-6 beers a day  . Drug use: No     Allergies   Patient has no known allergies.   Review of Systems Review of Systems  Constitutional: Negative for chills, diaphoresis, fatigue, fever and malaise/fatigue.  HENT: Positive for congestion and rhinorrhea.   Eyes: Negative for visual disturbance.  Respiratory: Positive for cough. Negative for hemoptysis, chest tightness, shortness of breath, wheezing and stridor.   Cardiovascular: Positive for palpitations. Negative for chest pain, leg swelling, syncope and near-syncope.  Gastrointestinal: Negative for abdominal pain, constipation, diarrhea, nausea and vomiting.  Genitourinary: Negative for flank pain and frequency.  Musculoskeletal: Negative for back pain, neck pain and neck stiffness.  Skin: Negative for  rash and wound.  Neurological: Negative for dizziness, weakness, light-headedness, numbness and headaches.  Psychiatric/Behavioral: Negative for agitation.  All other systems reviewed and are negative.    Physical Exam Updated Vital Signs BP (!) 169/96   Pulse (!) 101   Temp 98.8 F (37.1 C) (Oral)   Resp (!) 23   Ht  (1.905 m)   Wt 113.4 kg   SpO2 97%   BMI 31.25 kg/m   Physical Exam Vitals signs and nursing note reviewed.   Constitutional:      General: He is not in acute distress.    Appearance: He is well-developed. He is obese. He is not ill-appearing, toxic-appearing or diaphoretic.  HENT:     Head: Normocephalic and atraumatic.     Nose: Congestion and rhinorrhea present.     Mouth/Throat:     Mouth: Mucous membranes are dry.     Pharynx: No oropharyngeal exudate or posterior oropharyngeal erythema.  Eyes:     Conjunctiva/sclera: Conjunctivae normal.     Pupils: Pupils are equal, round, and reactive to light.  Neck:     Musculoskeletal: Neck supple. No muscular tenderness.  Cardiovascular:     Rate and Rhythm: Regular rhythm. Tachycardia present.     Pulses: Normal pulses.     Heart sounds: No murmur.  Pulmonary:     Effort: Pulmonary effort is normal. No respiratory distress.     Breath sounds: Normal breath sounds. No wheezing, rhonchi or rales.  Chest:     Chest wall: No tenderness.  Abdominal:     General: There is no distension.     Palpations: Abdomen is soft.     Tenderness: There is no abdominal tenderness.  Skin:    General: Skin is warm and dry.     Capillary Refill: Capillary refill takes less than 2 seconds.  Neurological:     Mental Status: He is alert and oriented to person, place, and time.  Psychiatric:        Mood and Affect: Mood normal.      ED Treatments / Results  Labs (all labs ordered are listed, but only abnormal results are displayed) Labs Reviewed  MAGNESIUM - Abnormal; Notable for the following components:      Result Value   Magnesium 1.6 (*)    All other components within normal limits  CBC WITH DIFFERENTIAL/PLATELET - Abnormal; Notable for the following components:   RBC 4.16 (*)    RDW 11.1 (*)    All other components within normal limits  COMPREHENSIVE METABOLIC PANEL - Abnormal; Notable for the following components:   CO2 15 (*)    Glucose, Bld 166 (*)    Calcium 8.8 (*)    Anion gap 17 (*)    All other components within normal limits   TROPONIN I  TSH  D-DIMER, QUANTITATIVE (NOT AT [facility])  CK    EKG EKG Interpretation  Date/Time:  Tuesday June 18 2018 00:13:13 EDT Ventricular Rate:  112 PR Interval:    QRS Duration: 100 QT Interval:  340 QTC Calculation: 465 R Axis:   51 Text Interpretation:  Sinus tachycardia Probable left atrial enlargement RSR' in V1 or V2, right VCD or RVH When compared to prior, faster rate.  No STEMI Confirmed by Theda Belfast ([id number]) on 06/18/2018 12:22:35 AM   Radiology Dg Chest 2 View  Result Date: 06/18/2018 CLINICAL DATA:  Tachycardia and cough EXAM: CHEST - 2 VIEW COMPARISON:  03/18/2016 FINDINGS: The heart size and mediastinal contours are  within normal limits. Both lungs are clear. The visualized skeletal structures are unremarkable. IMPRESSION: No active cardiopulmonary disease. Electronically Signed   By: Deatra RobinsonKevin  Herman M.D.   On: 06/18/2018 01:09    Procedures Procedures (including critical care time)  Medications Ordered in ED Medications  sodium chloride 0.9 % bolus 1,000 mL (0 mLs Intravenous Stopped 06/18/18 0130)     Initial Impression / Assessment and Plan / ED Course  I have reviewed the triage vital signs and the nursing notes.  Pertinent labs & imaging results that were available during my care of the patient were reviewed by me and considered in my medical decision making (see chart for details).        Peter Mcclure is a 48 y.o. male with a past medical history significant for hypertension and diabetes who presents with palpitations and dry cough.  Patient reports that for the last week he is been having palpitations on and off.  He reports he had this several months ago but it resolved quickly.  He denies any chest pain or shortness of breath.  Denies any nausea, vomiting, diaphoresis, discomfort, abdominal pain, flank pain, urinary symptoms, groin symptoms, leg pain or leg swelling.  He only complains of the palpitations and says he has a dry cough and rhinorrhea  likely related to seasonal allergies.  He reports he has not been drinking much water recently and has been exercising more.  He denies recent trauma and denies any medication changes.  He denies other complaints on arrival.  On exam, lungs are clear and chest is nontender.  Abdomen is nontender.  Patient is symmetric pulses in all extremities and legs are nontender nonedematous.  Patient has no murmur and he has unremarkable exam otherwise.  He is afebrile but is tachycardic with a rate around 1 15-1 20 on my initial exam.  Because membranes do appear somewhat dry.  EKG shows sinus tachycardia.  No evidence of A. fib or a flutter.  Due to palpitations, cough, and tachycardia, he will have chest x-ray to look for pneumonia with his cough.  He will also have screen laboratory testing.  We will also send TSH and due to the tachycardia without unknown cause, will send a d-dimer as well after our conversation.  Will send CK due to report of increased exercise activity with decreased fluids.  If heart rate improved with fluids and work-up is reassuring, anticipate discharge home.  3:15 AM On reassessment after fluids, heart rate has improved below 100.  Heart rate was in the mid 90s.  Patient's laboratory testing showed negative d-dimer, negative troponin, and normal TSH.  Other labs showed mild hypomagnesemia but otherwise was reassuring.  X-ray shows no acute cardiopulmonary abnormality and EKG reassuring with sinus tachycardia.  No evidence of arrhythmia.  Patient subsequently reports that he has increased his drinking due to the coronavirus quarantine.  He thinks this may have caused him to be dehydrated.  I agree.  Patient will increase his hydration and follow-up with his primary doctor.  He understands return precautions for any new or worsened symptoms.  Patient other questions or concerns and was discharged in good condition.     Final Clinical Impressions(s) / ED Diagnoses   Final diagnoses:   Palpitations  Tachycardia  Dehydration    ED Discharge Orders    None      Clinical Impression: 1. Palpitations   2. Tachycardia   3. Dehydration     Disposition: Discharge  Condition: Good  I  have discussed the results, Dx and Tx plan with the pt(& family if present). He/she/they expressed understanding and agree(s) with the plan. Discharge instructions discussed at great length. Strict return precautions discussed and pt &/or family have verbalized understanding of the instructions. No further questions at time of discharge.    New Prescriptions   No medications on file    Follow Up: [name], MD [address] [place] [address] [phone]     [facility] DEPT [address] [id number] mc [place] [address] [phone]       Leshon Armistead, Canary Brim, MD 06/18/18 [phone]

## 2018-06-19 ENCOUNTER — Ambulatory Visit (INDEPENDENT_AMBULATORY_CARE_PROVIDER_SITE_OTHER): Payer: BLUE CROSS/BLUE SHIELD | Admitting: Family Medicine

## 2018-06-19 DIAGNOSIS — E1165 Type 2 diabetes mellitus with hyperglycemia: Secondary | ICD-10-CM | POA: Diagnosis not present

## 2018-06-19 DIAGNOSIS — R790 Abnormal level of blood mineral: Secondary | ICD-10-CM | POA: Diagnosis not present

## 2018-06-19 DIAGNOSIS — I10 Essential (primary) hypertension: Secondary | ICD-10-CM | POA: Diagnosis not present

## 2018-06-19 DIAGNOSIS — R002 Palpitations: Secondary | ICD-10-CM

## 2018-06-19 NOTE — Progress Notes (Signed)
Patient ID: Peter Mcclure, male   DOB: 02/15/1971, 48 y.o.   MRN: [id number]  Virtual Visit via Video Note  I connected with Peter Mcclure on 06/19/18 at 10:30 AM EDT by a video enabled telemedicine application and verified that I am speaking with the correct person using two identifiers.  Location patient: home Location provider:work or home office Persons participating in the virtual visit: patient, provider  I discussed the limitations of evaluation and management by telemedicine and the availability of in person appointments. The patient expressed understanding and agreed to proceed.   HPI: This visit is in follow-up from ER visit yesterday.  Patient has chronic problems including poorly controlled type 2 diabetes, obesity, hypertension, hyperlipidemia.  He presented to the ER yesterday with some palpitations and dry cough.  No fever.  No chest pain.  He had chest x-ray which showed no acute findings.  EKG showed mild sinus tachycardia.  Patient was felt to be clinically slightly dehydrated.  He was given some IV fluids and heart rate came down somewhat.  Patient related drinking more alcohol than usual recently.  On further questioning, has  been drinking about 4-8 beers per day  Had multiple labs.  These were reviewed.  Troponin and CK levels normal.  TSH normal.  Magnesium level slightly low 1.6.  D-dimer normal.  CBC unremarkable.  Comprehensive metabolic panel glucose 166 but this was non-fasting  Known type 2 diabetes.  Takes metformin.  Not monitoring blood sugars regularly.  Denies any consistent polyuria or polydipsia.  Last A1c 8.7%.  He is overdue for follow-up.   ROS: See pertinent positives and negatives per HPI.  Past Medical History:  Diagnosis Date  . Diabetes mellitus without complication (HCC)   . Hypertension     No past surgical history on file.  Family History  Problem Relation Age of Onset  . Diabetes Other   . Hypertension Other     SOCIAL HX: Recent increased  alcohol consumption as above.  Non-smoker   Current Outpatient Medications:  .  acetaminophen (TYLENOL) 500 MG tablet, Take 500 mg by mouth every 6 (six) hours as needed for moderate pain., Disp: , Rfl:  .  atorvastatin (LIPITOR) 20 MG tablet, Take 1 tablet by mouth once daily (Patient taking differently: Take 20 mg by mouth daily. ), Disp: 90 tablet, Rfl: 0 .  cyclobenzaprine (FLEXERIL) 5 MG tablet, Take 1 tablet (5 mg total) by mouth at bedtime. (Patient not taking: Reported on 06/18/2018), Disp: 7 tablet, Rfl: 0 .  ibuprofen (ADVIL,MOTRIN) 600 MG tablet, Take 1 tablet (600 mg total) by mouth every 6 (six) hours as needed. (Patient not taking: Reported on 06/18/2018), Disp: 30 tablet, Rfl: 0 .  lisinopril (PRINIVIL,ZESTRIL) 10 MG tablet, Take 1 tablet by mouth once daily (Patient taking differently: Take 10 mg by mouth daily. ), Disp: 30 tablet, Rfl: 0 .  metFORMIN (GLUCOPHAGE) 500 MG tablet, Take two tablets twice daily. (Patient taking differently: Take 1,000 mg by mouth 2 (two) times daily with a meal. ), Disp: 360 tablet, Rfl: 3 .  Omega-3 Fatty Acids (FISH OIL) 1000 MG CAPS, Take 1,000 mg by mouth 2 (two) times a week., Disp: , Rfl:   EXAM:  VITALS per patient if applicable:  GENERAL: alert, oriented, appears well and in no acute distress  HEENT: atraumatic, conjunttiva clear, no obvious abnormalities on inspection of external nose and ears  NECK: normal movements of the head and neck  LUNGS: on inspection no signs of respiratory distress,  breathing rate appears normal, no obvious gross SOB, gasping or wheezing  CV: no obvious cyanosis  MS: moves all visible extremities without noticeable abnormality  PSYCH/NEURO: pleasant and cooperative, no obvious depression or anxiety, speech and thought processing grossly intact  ASSESSMENT AND PLAN:  Discussed the following assessment and plan:  #1 recent palpitations.  EKG in ER unremarkable except for mild sinus tachycardia.  TSH normal.   Slightly low magnesium.  Question related to mild dehydration and increased alcohol consumption  #2 low magnesium -Discussed foods that are high in magnesium and also suggested he may consider over-the-counter magnesium oxide 400 mg daily for the next week or 2  #3 type 2 diabetes.  History of poor control.  History of poor follow-up -We will plan to get back in here hopefully in a couple months and recheck A1c  #4 hypertension.  Elevation from visit in ER -Recommend monitoring and be in touch if consistently greater than 140/90  #5 excessive alcohol consumption -We recommend he abstain or at least reduce alcohol intake and he agrees     I discussed the assessment and treatment plan with the patient. The patient was provided an opportunity to ask questions and all were answered. The patient agreed with the plan and demonstrated an understanding of the instructions.   The patient was advised to call back or seek an in-person evaluation if the symptoms worsen or if the condition fails to improve as anticipated.  Evelena Peat, MD

## 2018-06-19 NOTE — Telephone Encounter (Signed)
I called patient and he has an appointment today on [place] with Dr. Caryl Never at 10:30 am.

## 2018-07-10 ENCOUNTER — Other Ambulatory Visit: Payer: Self-pay | Admitting: Family Medicine

## 2018-08-18 ENCOUNTER — Other Ambulatory Visit: Payer: Self-pay | Admitting: Family Medicine

## 2018-10-08 ENCOUNTER — Other Ambulatory Visit: Payer: Self-pay

## 2018-10-08 ENCOUNTER — Ambulatory Visit (INDEPENDENT_AMBULATORY_CARE_PROVIDER_SITE_OTHER): Payer: BLUE CROSS/BLUE SHIELD | Admitting: Family Medicine

## 2018-10-08 DIAGNOSIS — R0789 Other chest pain: Secondary | ICD-10-CM | POA: Diagnosis not present

## 2018-10-08 DIAGNOSIS — E1165 Type 2 diabetes mellitus with hyperglycemia: Secondary | ICD-10-CM

## 2018-10-08 NOTE — Progress Notes (Signed)
Patient ID: Peter Mcclure, male   DOB: 07/20/1970, 48 y.o.   MRN: [id number]  This visit type was conducted due to national recommendations for restrictions regarding the COVID-19 pandemic in an effort to limit this patient's exposure and mitigate transmission in our community.   Virtual Visit via Video Note  I connected with Peter ParrJamane Kammerer on 10/08/18 at  3:00 PM EDT by a video enabled telemedicine application and verified that I am speaking with the correct person using two identifiers.  Location patient: home Location provider:work or home office Persons participating in the virtual visit: patient, provider  I discussed the limitations of evaluation and management by telemedicine and the availability of in person appointments. The patient expressed understanding and agreed to proceed.   HPI: Patient has chronic problems including hypertension, obesity, type 2 diabetes.  No recent follow-up.  He is seen with some vague symptoms of anterior chest symptoms.  Not describing as pressure sensation but he states that "just does not feel right ".  No classic or typical GERD symptoms though he has had some in the past.  Does have occasional dyspepsia especially after sweets.  He has had some increased palpitations and had episode couple weeks ago with what he thought may have been a panic attack.  He had this during the middle the night.  Had been exercising regularly up until pandemic and had no exertional chest symptoms whatsoever.  ER visit from April reviewed.  He had EKG and labs then that were unremarkable except for slightly low magnesium  No associated dyspnea.  No dysphagia.  No relation to eating.  He has not had any radiation of discomfort toward the arm or neck.  He takes Lipitor 20 mg daily and also metformin.  Overdue for A1c.  Non-smoker.  No regular alcohol use.    ROS: See pertinent positives and negatives per HPI.  Past Medical History:  Diagnosis Date  . Diabetes mellitus without  complication (HCC)   . Hypertension     No past surgical history on file.  Family History  Problem Relation Age of Onset  . Diabetes Other   . Hypertension Other     SOCIAL HX: Non-smoker   Current Outpatient Medications:  .  acetaminophen (TYLENOL) 500 MG tablet, Take 500 mg by mouth every 6 (six) hours as needed for moderate pain., Disp: , Rfl:  .  atorvastatin (LIPITOR) 20 MG tablet, Take 1 tablet by mouth once daily, Disp: 90 tablet, Rfl: 0 .  cyclobenzaprine (FLEXERIL) 5 MG tablet, Take 1 tablet (5 mg total) by mouth at bedtime. (Patient not taking: Reported on 06/18/2018), Disp: 7 tablet, Rfl: 0 .  ibuprofen (ADVIL,MOTRIN) 600 MG tablet, Take 1 tablet (600 mg total) by mouth every 6 (six) hours as needed. (Patient not taking: Reported on 06/18/2018), Disp: 30 tablet, Rfl: 0 .  lisinopril (ZESTRIL) 10 MG tablet, Take 1 tablet by mouth once daily, Disp: 30 tablet, Rfl: 3 .  metFORMIN (GLUCOPHAGE) 500 MG tablet, Take two tablets twice daily. (Patient taking differently: Take 1,000 mg by mouth 2 (two) times daily with a meal. ), Disp: 360 tablet, Rfl: 3 .  Omega-3 Fatty Acids (FISH OIL) 1000 MG CAPS, Take 1,000 mg by mouth 2 (two) times a week., Disp: , Rfl:   EXAM:  VITALS per patient if applicable:  GENERAL: alert, oriented, appears well and in no acute distress  HEENT: atraumatic, conjunttiva clear, no obvious abnormalities on inspection of external nose and ears  NECK: normal movements of  the head and neck  LUNGS: on inspection no signs of respiratory distress, breathing rate appears normal, no obvious gross SOB, gasping or wheezing  CV: no obvious cyanosis  MS: moves all visible extremities without noticeable abnormality  PSYCH/NEURO: pleasant and cooperative, no obvious depression or anxiety, speech and thought processing grossly intact  ASSESSMENT AND PLAN:  Discussed the following assessment and plan:  #1 atypical chest symptoms.  We recommended office follow-up to  further assess and we will try to set up complete physical -We did discuss setting up possible exercise tolerance test given his at least moderate risk factors and very atypical symptoms -We have also recommended trial of Pepcid 20 mg twice daily in the meantime -He knows to call 911 or go directly to ER if he has any progressive symptoms  #2 type 2 diabetes.  History of poor control.  Needs follow-up.  We will plan A1c at follow-up for physical and need tighter control his blood sugar and other risk factors     I discussed the assessment and treatment plan with the patient. The patient was provided an opportunity to ask questions and all were answered. The patient agreed with the plan and demonstrated an understanding of the instructions.   The patient was advised to call back or seek an in-person evaluation if the symptoms worsen or if the condition fails to improve as anticipated.   Carolann Littler, MD

## 2018-10-28 ENCOUNTER — Encounter: Payer: Self-pay | Admitting: Family Medicine

## 2018-10-28 ENCOUNTER — Ambulatory Visit (INDEPENDENT_AMBULATORY_CARE_PROVIDER_SITE_OTHER): Payer: BLUE CROSS/BLUE SHIELD | Admitting: Family Medicine

## 2018-10-28 VITALS — BP 136/72 | HR 101 | Temp 97.8°F | Ht 74.0 in | Wt 251.0 lb

## 2018-10-28 DIAGNOSIS — E785 Hyperlipidemia, unspecified: Secondary | ICD-10-CM

## 2018-10-28 DIAGNOSIS — E1165 Type 2 diabetes mellitus with hyperglycemia: Secondary | ICD-10-CM

## 2018-10-28 DIAGNOSIS — Z0000 Encounter for general adult medical examination without abnormal findings: Secondary | ICD-10-CM

## 2018-10-28 DIAGNOSIS — Z23 Encounter for immunization: Secondary | ICD-10-CM | POA: Diagnosis not present

## 2018-10-28 LAB — PSA: PSA: 0.3 ng/mL (ref 0.10–4.00)

## 2018-10-28 LAB — CBC WITH DIFFERENTIAL/PLATELET
Basophils Absolute: 0 10*3/uL (ref 0.0–0.1)
Basophils Relative: 0.5 % (ref 0.0–3.0)
Eosinophils Absolute: 0.1 10*3/uL (ref 0.0–0.7)
Eosinophils Relative: 2 % (ref 0.0–5.0)
HCT: 40.8 % (ref 39.0–52.0)
Hemoglobin: 13.9 g/dL (ref 13.0–17.0)
Lymphocytes Relative: 31.3 % (ref 12.0–46.0)
Lymphs Abs: 2 10*3/uL (ref 0.7–4.0)
MCHC: 34.1 g/dL (ref 30.0–36.0)
MCV: 94.5 fl (ref 78.0–100.0)
Monocytes Absolute: 0.6 10*3/uL (ref 0.1–1.0)
Monocytes Relative: 9.1 % (ref 3.0–12.0)
Neutro Abs: 3.7 10*3/uL (ref 1.4–7.7)
Neutrophils Relative %: 57.1 % (ref 43.0–77.0)
Platelets: 268 10*3/uL (ref 150.0–400.0)
RBC: 4.32 Mil/uL (ref 4.22–5.81)
RDW: 12.1 % (ref 11.5–15.5)
WBC: 6.5 10*3/uL (ref 4.0–10.5)

## 2018-10-28 LAB — MICROALBUMIN / CREATININE URINE RATIO
Creatinine,U: 176.7 mg/dL
Microalb Creat Ratio: 3.4 mg/g (ref 0.0–30.0)
Microalb, Ur: 6 mg/dL — ABNORMAL HIGH (ref 0.0–1.9)

## 2018-10-28 LAB — LIPID PANEL
Cholesterol: 152 mg/dL (ref 0–200)
HDL: 41.5 mg/dL (ref >39.00)
LDL Cholesterol: 75 mg/dL (ref 0–99)
NonHDL: 110.42
Total CHOL/HDL Ratio: 4
Triglycerides: 179 mg/dL — ABNORMAL HIGH (ref 0.0–149.0)
VLDL: 35.8 mg/dL (ref 0.0–40.0)

## 2018-10-28 LAB — BASIC METABOLIC PANEL
BUN: 12 mg/dL (ref 6–23)
CO2: 23 mEq/L (ref 19–32)
Calcium: 9.8 mg/dL (ref 8.4–10.5)
Chloride: 101 mEq/L (ref 96–112)
Creatinine, Ser: 0.76 mg/dL (ref 0.40–1.50)
GFR: 132.53 mL/min (ref >60.00)
Glucose, Bld: 178 mg/dL — ABNORMAL HIGH (ref 70–99)
Potassium: 4 mEq/L (ref 3.5–5.1)
Sodium: 138 mEq/L (ref 135–145)

## 2018-10-28 LAB — HEPATIC FUNCTION PANEL
ALT: 46 U/L (ref 0–53)
AST: 33 U/L (ref 0–37)
Albumin: 4.7 g/dL (ref 3.5–5.2)
Alkaline Phosphatase: 97 U/L (ref 39–117)
Bilirubin, Direct: 0.2 mg/dL (ref 0.0–0.3)
Total Bilirubin: 0.7 mg/dL (ref 0.2–1.2)
Total Protein: 7.9 g/dL (ref 6.0–8.3)

## 2018-10-28 LAB — TSH: TSH: 2.23 u[IU]/mL (ref 0.35–4.50)

## 2018-10-28 LAB — HEMOGLOBIN A1C: Hgb A1c MFr Bld: 7.5 % — ABNORMAL HIGH (ref 4.6–6.5)

## 2018-10-28 NOTE — Progress Notes (Signed)
Subjective:     Patient ID: Peter Mcclure, male   DOB: 01/12/1971, 48 y.o.   MRN: [id number]  HPI   Pt is here for CPE.  His chronic problems include hypertension, GERD, type 2 diabetes, hyperlipidemia.  We started him on Lipitor last year but is not had any recent follow-up labs.  He is on metformin for type 2 diabetes.  History of poor control of diabetes.  He is exercising more regularly but has poor compliance with diet.  His weight is essentially unchanged.  Denies any recent exertional chest pains.  He had some recent reflux type symptoms and started over-the-counter apple cider vinegar and symptoms seem to be improved.  Needs tetanus booster.  No history of Pneumovax.  Past Medical History:  Diagnosis Date  . Diabetes mellitus without complication (HCC)   . Hypertension    History reviewed. No pertinent surgical history.  reports that he has never smoked. He has never used smokeless tobacco. He reports current alcohol use of about 4.0 standard drinks of alcohol per week. He reports that he does not use drugs. family history includes Diabetes in an other family member; Hypertension in an other family member. No Known Allergies   Review of Systems  Constitutional: Negative for activity change, appetite change, fatigue and fever.  HENT: Negative for congestion, ear pain and trouble swallowing.   Eyes: Negative for pain and visual disturbance.  Respiratory: Negative for cough, shortness of breath and wheezing.   Cardiovascular: Negative for chest pain and palpitations.  Gastrointestinal: Negative for abdominal distention, abdominal pain, blood in stool, constipation, diarrhea, nausea, rectal pain and vomiting.  Endocrine: Negative for polydipsia and polyuria.  Genitourinary: Negative for dysuria, hematuria and testicular pain.  Musculoskeletal: Negative for arthralgias and joint swelling.  Skin: Negative for rash.  Neurological: Negative for dizziness, syncope and headaches.   Hematological: Negative for adenopathy.  Psychiatric/Behavioral: Negative for confusion and dysphoric mood.       Objective:   Physical Exam Constitutional:      General: He is not in acute distress.    Appearance: He is well-developed.  HENT:     Head: Normocephalic and atraumatic.     Right Ear: External ear normal.     Left Ear: External ear normal.  Eyes:     Conjunctiva/sclera: Conjunctivae normal.     Pupils: Pupils are equal, round, and reactive to light.  Neck:     Musculoskeletal: Normal range of motion and neck supple.     Thyroid: No thyromegaly.  Cardiovascular:     Rate and Rhythm: Normal rate and regular rhythm.     Heart sounds: Normal heart sounds. No murmur.  Pulmonary:     Effort: No respiratory distress.     Breath sounds: No wheezing or rales.  Abdominal:     General: Bowel sounds are normal. There is no distension.     Palpations: Abdomen is soft. There is no mass.     Tenderness: There is no abdominal tenderness. There is no guarding or rebound.  Lymphadenopathy:     Cervical: No cervical adenopathy.  Skin:    Findings: No rash.     Comments: Feet reveal no skin lesions. Good distal foot pulses. Good capillary refill. No calluses. Normal sensation with monofilament testing   Neurological:     Mental Status: He is alert and oriented to person, place, and time.     Cranial Nerves: No cranial nerve deficit.     Deep Tendon Reflexes: Reflexes normal.  Assessment:     Physical exam.  Patient has history of poorly controlled type 2 diabetes.  Initial elevated blood pressure today 160/84 and repeat left arm seated after rest much improved (136/72).  We addressed the following health maintenance issues as below    Plan:     -Obtain follow-up labs including A1c -Tdap and Pneumovax given -Needs to set up diabetic eye exam -If A1c not substantially improved consider SGLT2 medication such as Jardiance -Discussed strategies for weight control  with exercise and diet -Recommend yearly flu vaccine -45-month diabetic follow-up  Eulas Post MD [facility]

## 2018-11-25 ENCOUNTER — Telehealth: Payer: Self-pay

## 2018-11-25 NOTE — Telephone Encounter (Signed)
Copied from [place] [id number]. Topic: Quick Communication - Rx Refill/Question >> Nov 25, 2018 12:39 PM Erick Blinks wrote: Medication: atorvastatin (LIPITOR) 20 MG tablet   Pt is requesting call back to discuss whether he should continue taking medication please advise. Best contact: [phone]

## 2018-11-25 NOTE — Telephone Encounter (Signed)
Would definitely continue.  Lipids were much improved on Lipitor c/w last year.

## 2018-11-26 NOTE — Telephone Encounter (Signed)
Called patient and NOT able to LMOVM to return call  [place] for [facility] to Discuss results / PCP / recommendations / Schedule patient  Per Dr. Elease Hashimoto: Would definitely continue.  Lipids were much improved on Lipitor c/w last year.  CRM Created.

## 2018-11-27 ENCOUNTER — Other Ambulatory Visit: Payer: Self-pay | Admitting: Family Medicine

## 2018-12-02 ENCOUNTER — Inpatient Hospital Stay (HOSPITAL_COMMUNITY)
Admission: RE | Admit: 2018-12-02 | Discharge: 2018-12-02 | Disposition: A | Discharge status: Home / Self Care | Payer: BLUE CROSS/BLUE SHIELD | Source: Ambulatory Visit

## 2018-12-02 NOTE — Progress Notes (Signed)
Unable to LVM for pt regarding him not showing up for the COVID test.

## 2018-12-03 ENCOUNTER — Telehealth (HOSPITAL_COMMUNITY): Payer: Self-pay

## 2018-12-03 NOTE — Telephone Encounter (Signed)
Encounter complete. 

## 2018-12-04 ENCOUNTER — Telehealth (HOSPITAL_COMMUNITY): Payer: Self-pay

## 2018-12-04 NOTE — Telephone Encounter (Signed)
Encounter complete. 

## 2018-12-05 ENCOUNTER — Ambulatory Visit (HOSPITAL_COMMUNITY)
Admission: RE | Admit: 2018-12-05 | Payer: BLUE CROSS/BLUE SHIELD | Source: Ambulatory Visit | Attending: Family Medicine | Admitting: Family Medicine

## 2018-12-06 ENCOUNTER — Encounter (HOSPITAL_COMMUNITY): Payer: Self-pay | Admitting: Family Medicine

## 2018-12-11 ENCOUNTER — Telehealth (HOSPITAL_COMMUNITY): Payer: Self-pay

## 2018-12-11 NOTE — Telephone Encounter (Signed)
New message    Just an FYI. We have made several attempts to contact this patient including sending a letter to schedule or reschedule their echocardiogram. We will be removing the patient from the echo WQ.   9.29.20 @ 10:47ambioth @ are the same  vm not set up - Elvin Banker  9.25.20 mail reminder letter Charline Hoskinson  9.24.20 Cancel Rsn: Provider (both # are the same - appt cancel due to no covid test on file - )

## 2019-01-28 ENCOUNTER — Ambulatory Visit: Payer: BLUE CROSS/BLUE SHIELD | Admitting: Family Medicine

## 2019-02-13 ENCOUNTER — Other Ambulatory Visit: Payer: Self-pay

## 2019-02-14 ENCOUNTER — Ambulatory Visit: Payer: BLUE CROSS/BLUE SHIELD | Admitting: Family Medicine

## 2019-02-17 ENCOUNTER — Ambulatory Visit: Payer: BLUE CROSS/BLUE SHIELD | Admitting: Family Medicine

## 2019-02-19 ENCOUNTER — Ambulatory Visit (INDEPENDENT_AMBULATORY_CARE_PROVIDER_SITE_OTHER): Payer: BLUE CROSS/BLUE SHIELD | Admitting: Family Medicine

## 2019-02-19 ENCOUNTER — Other Ambulatory Visit: Payer: Self-pay

## 2019-02-19 ENCOUNTER — Encounter: Payer: Self-pay | Admitting: Family Medicine

## 2019-02-19 VITALS — BP 122/78 | HR 100 | Temp 97.6°F | Ht 74.0 in | Wt 248.7 lb

## 2019-02-19 DIAGNOSIS — R002 Palpitations: Secondary | ICD-10-CM | POA: Diagnosis not present

## 2019-02-19 DIAGNOSIS — N529 Male erectile dysfunction, unspecified: Secondary | ICD-10-CM | POA: Diagnosis not present

## 2019-02-19 DIAGNOSIS — E1165 Type 2 diabetes mellitus with hyperglycemia: Secondary | ICD-10-CM | POA: Diagnosis not present

## 2019-02-19 DIAGNOSIS — I10 Essential (primary) hypertension: Secondary | ICD-10-CM

## 2019-02-19 LAB — POCT GLYCOSYLATED HEMOGLOBIN (HGB A1C): Hemoglobin A1C: 7.7 % — AB (ref 4.0–5.6)

## 2019-02-19 MED ORDER — SILDENAFIL CITRATE 100 MG PO TABS: ORAL_TABLET | ORAL | 11 refills | Status: DC

## 2019-02-19 MED ORDER — JARDIANCE 10 MG PO TABS: 10.0000 mg | ORAL_TABLET | Freq: Every day | ORAL | 11 refills | Status: DC

## 2019-02-19 MED ORDER — ATORVASTATIN CALCIUM 20 MG PO TABS: 20.0000 mg | ORAL_TABLET | Freq: Every day | ORAL | 11 refills | Status: DC

## 2019-02-19 NOTE — Progress Notes (Signed)
Subjective:     Patient ID: Peter Mcclure, male   DOB: 1970/04/15, 48 y.o.   MRN: [id number]  HPI   Peter Mcclure is seen for medical follow-up.  He has history of hypertension, type 2 diabetes, dyslipidemia.  He has not been taking his Lipitor consistently.  He was seen few months ago and A1c was improving at 7.5%.  We recommended Jardiance but he never started on that.  Poor compliance with diet and exercise since then.  Frequently drinking about 4 beers per day.  He had episode last night where he worked all day and then ate dinner and went to bed around 730 or 8.  He woke up around 11 PM with increased palpitations and called EMS.  There was no chest pain.  Heart rate by his count and his watch was 150.  Vital signs per EMS blood pressure initially 200/110 with subsequent 160/98 and pulse of 110 -120.  His blood pressure and pulse are much improved today.  His current medications are lisinopril and Metformin.  Not monitoring blood sugars regularly.  He states he has had at least 3 and possibly 4 episodes of tachypalpitations over the past 5 months.  He had normal TSH back in August.  No decongestant use.  No history of A. Fib  Other issue is erectile dysfunction.  He does not smoke.  He has not tried medications in the past such as Viagra  Past Medical History:  Diagnosis Date  . Diabetes mellitus without complication ([place])   . Hypertension    History reviewed. No pertinent surgical history.  reports that he has never smoked. He has never used smokeless tobacco. He reports current alcohol use of about 4.0 standard drinks of alcohol per week. He reports that he does not use drugs. family history includes Diabetes in an other family member; Hypertension in an other family member. No Known Allergies     Review of Systems  Constitutional: Negative for appetite change, fatigue and unexpected weight change.  Eyes: Negative for visual disturbance.  Respiratory: Negative for cough, chest  tightness and shortness of breath.   Cardiovascular: Positive for palpitations. Negative for chest pain and leg swelling.  Gastrointestinal: Negative for abdominal pain.  Endocrine: Negative for polydipsia and polyuria.  Neurological: Negative for dizziness, syncope, weakness, light-headedness and headaches.       Objective:   Physical Exam Vitals signs reviewed.  Constitutional:      Appearance: Normal appearance.  Cardiovascular:     Rate and Rhythm: Regular rhythm.     Comments: Rate in the 90s Pulmonary:     Effort: Pulmonary effort is normal.     Breath sounds: Normal breath sounds.  Musculoskeletal:     Right lower leg: No edema.     Left lower leg: No edema.  Neurological:     Mental Status: He is alert.        Assessment:     #1 type 2 diabetes suboptimally controlled with A1c 7.7%  #2 hypertension.  He had spike last night but this was after drinking several beers.  Much improved today after rest with reading by me left arm seated large cuff 128/78  #3 hyperlipidemia.  Strongly advise getting back on statin  #4 erectile dysfunction  #5 tachypalpitations.  Recent TSH normal.  Possibly exacerbated by alcohol    Plan:     -Check EKG= no acute ST-T changes. -Get back on Lipitor 10 mg daily -Continue Metformin and add Jardiance 10 mg once daily -Scale  back alcohol use and try to lose some weight -Recommend 81-month follow-up to recheck A1c after starting the Jardiance. -Set up cardiac event monitor to help further clarify his recurrent tachypalpitations -Discussed possible as needed use of beta-blocker but at this point he declines  [name] MD  Primary [facility] at [facility]

## 2019-02-19 NOTE — Patient Instructions (Addendum)
-  Get back on the Lipitor daily  -Start Jardiance 10 mg once daily and take in the morning with breakfast  -Continue with the Metformin and lisinopril  -Scale back alcohol use  -Try to lose some weight.  Would like to get you back in 3 months to recheck A1c  -we will set up event monitor.

## 2019-02-26 ENCOUNTER — Other Ambulatory Visit: Payer: Self-pay | Admitting: Family Medicine

## 2019-03-01 ENCOUNTER — Other Ambulatory Visit: Payer: Self-pay | Admitting: Family Medicine

## 2019-03-18 ENCOUNTER — Encounter: Payer: Self-pay | Admitting: Family Medicine

## 2019-03-18 ENCOUNTER — Other Ambulatory Visit: Payer: Self-pay

## 2019-03-18 ENCOUNTER — Telehealth (INDEPENDENT_AMBULATORY_CARE_PROVIDER_SITE_OTHER): Payer: BLUE CROSS/BLUE SHIELD | Admitting: Family Medicine

## 2019-03-18 DIAGNOSIS — R140 Abdominal distension (gaseous): Secondary | ICD-10-CM

## 2019-03-18 DIAGNOSIS — K219 Gastro-esophageal reflux disease without esophagitis: Secondary | ICD-10-CM

## 2019-03-18 NOTE — Progress Notes (Signed)
This visit type was conducted due to national recommendations for restrictions regarding the COVID-19 pandemic in an effort to limit this patient's exposure and mitigate transmission in our community.   Virtual Visit via Video Note  I connected with Peter Mcclure on 03/18/19 at  1:30 PM EST by a video enabled telemedicine application and verified that I am speaking with the correct person using two identifiers.  Location patient: home Location provider:work or home office Persons participating in the virtual visit: patient, provider  I discussed the limitations of evaluation and management by telemedicine and the availability of in person appointments. The patient expressed understanding and agreed to proceed.   HPI: Peter Mcclure has had some intermittent diffuse upper abdominal bloating.  He notices this particularly after spicy foods.  He has had some occasional GERD symptoms and thinks this is probably GERD related.  He is trying to scale back alcohol and caffeine.  He denies any dysphagia.  No localized abdominal pain.  No nausea or vomiting.  No appetite or weight changes  Has type 2 diabetes with poor control.  We recently added Jardiance but he was unable to get filled because of insurance and increased cost   ROS: See pertinent positives and negatives per HPI.  Past Medical History:  Diagnosis Date  . Diabetes mellitus without complication (HCC)   . Hypertension     No past surgical history on file.  Family History  Problem Relation Age of Onset  . Diabetes Other   . Hypertension Other     SOCIAL HX: Non-smoker   Current Outpatient Medications:  .  acetaminophen (TYLENOL) 500 MG tablet, Take 500 mg by mouth every 6 (six) hours as needed for moderate pain., Disp: , Rfl:  .  atorvastatin (LIPITOR) 20 MG tablet, Take 1 tablet (20 mg total) by mouth daily., Disp: 30 tablet, Rfl: 11 .  cyclobenzaprine (FLEXERIL) 5 MG tablet, Take 1 tablet (5 mg total) by mouth at bedtime. (Patient  not taking: Reported on 06/18/2018), Disp: 7 tablet, Rfl: 0 .  empagliflozin (JARDIANCE) 10 MG TABS tablet, Take 10 mg by mouth daily before breakfast., Disp: 30 tablet, Rfl: 11 .  ibuprofen (ADVIL,MOTRIN) 600 MG tablet, Take 1 tablet (600 mg total) by mouth every 6 (six) hours as needed., Disp: 30 tablet, Rfl: 0 .  lisinopril (ZESTRIL) 10 MG tablet, Take 1 tablet by mouth once daily, Disp: 90 tablet, Rfl: 0 .  metFORMIN (GLUCOPHAGE) 500 MG tablet, Take 2 tablets by mouth twice daily, Disp: 360 tablet, Rfl: 1 .  Omega-3 Fatty Acids (FISH OIL) 1000 MG CAPS, Take 1,000 mg by mouth 2 (two) times a week., Disp: , Rfl:  .  sildenafil (VIAGRA) 100 MG tablet, Take one half to one tablet daily as needed for erectile dysfunction., Disp: 10 tablet, Rfl: 11  EXAM:  VITALS per patient if applicable:  GENERAL: alert, oriented, appears well and in no acute distress  HEENT: atraumatic, conjunttiva clear, no obvious abnormalities on inspection of external nose and ears  NECK: normal movements of the head and neck  LUNGS: on inspection no signs of respiratory distress, breathing rate appears normal, no obvious gross SOB, gasping or wheezing  CV: no obvious cyanosis  MS: moves all visible extremities without noticeable abnormality  PSYCH/NEURO: pleasant and cooperative, no obvious depression or anxiety, speech and thought processing grossly intact  ASSESSMENT AND PLAN:  Discussed the following assessment and plan:  Nonspecific upper abdominal bloating.  He is not any red flags such as weight loss,  dysphagia, pain with swallowing, or any localized abdominal pain.  Question GERD related  -Discussed dietary modification.  Continue to scale back caffeine and alcohol -Recommend trial of Pepcid 20 mg twice daily -Touch base in 1 to 2 weeks if not improving with Pepcid -Also avoid eating right before bedtime within a few hours -Touch base for any persistent or worsening symptoms     I discussed the  assessment and treatment plan with the patient. The patient was provided an opportunity to ask questions and all were answered. The patient agreed with the plan and demonstrated an understanding of the instructions.   The patient was advised to call back or seek an in-person evaluation if the symptoms worsen or if the condition fails to improve as anticipated.    Carolann Littler, MD

## 2019-03-30 ENCOUNTER — Emergency Department (HOSPITAL_COMMUNITY): Payer: Self-pay

## 2019-03-30 ENCOUNTER — Emergency Department (HOSPITAL_COMMUNITY)
Admission: EM | Admit: 2019-03-30 | Discharge: 2019-03-30 | Disposition: A | Discharge status: Home / Self Care | Payer: Self-pay | Attending: Emergency Medicine | Admitting: Emergency Medicine

## 2019-03-30 ENCOUNTER — Encounter (HOSPITAL_COMMUNITY): Payer: Self-pay | Admitting: Emergency Medicine

## 2019-03-30 ENCOUNTER — Other Ambulatory Visit: Payer: Self-pay

## 2019-03-30 DIAGNOSIS — E1165 Type 2 diabetes mellitus with hyperglycemia: Secondary | ICD-10-CM | POA: Insufficient documentation

## 2019-03-30 DIAGNOSIS — R739 Hyperglycemia, unspecified: Secondary | ICD-10-CM

## 2019-03-30 DIAGNOSIS — R911 Solitary pulmonary nodule: Secondary | ICD-10-CM | POA: Insufficient documentation

## 2019-03-30 DIAGNOSIS — Z79899 Other long term (current) drug therapy: Secondary | ICD-10-CM | POA: Insufficient documentation

## 2019-03-30 DIAGNOSIS — I10 Essential (primary) hypertension: Secondary | ICD-10-CM | POA: Insufficient documentation

## 2019-03-30 DIAGNOSIS — R000 Tachycardia, unspecified: Secondary | ICD-10-CM | POA: Insufficient documentation

## 2019-03-30 DIAGNOSIS — Z7984 Long term (current) use of oral hypoglycemic drugs: Secondary | ICD-10-CM | POA: Insufficient documentation

## 2019-03-30 LAB — COMPREHENSIVE METABOLIC PANEL
ALT: 55 U/L — ABNORMAL HIGH (ref 0–44)
AST: 33 U/L (ref 15–41)
Albumin: 4.7 g/dL (ref 3.5–5.0)
Alkaline Phosphatase: 78 U/L (ref 38–126)
Anion gap: 15 (ref 5–15)
BUN: 12 mg/dL (ref 6–20)
CO2: 19 mmol/L — ABNORMAL LOW (ref 22–32)
Calcium: 9.6 mg/dL (ref 8.9–10.3)
Chloride: 101 mmol/L (ref 98–111)
Creatinine, Ser: 0.61 mg/dL (ref 0.61–1.24)
GFR calc Af Amer: >60 mL/min (ref >60)
GFR calc non Af Amer: >60 mL/min (ref >60)
Glucose, Bld: 200 mg/dL — ABNORMAL HIGH (ref 70–99)
Potassium: 3.5 mmol/L (ref 3.5–5.1)
Sodium: 135 mmol/L (ref 135–145)
Total Bilirubin: 1.4 mg/dL — ABNORMAL HIGH (ref 0.3–1.2)
Total Protein: 8.5 g/dL — ABNORMAL HIGH (ref 6.5–8.1)

## 2019-03-30 LAB — CBC
HCT: 44.2 % (ref 39.0–52.0)
Hemoglobin: 14.8 g/dL (ref 13.0–17.0)
MCH: 31.6 pg (ref 26.0–34.0)
MCHC: 33.5 g/dL (ref 30.0–36.0)
MCV: 94.2 fL (ref 80.0–100.0)
Platelets: 292 10*3/uL (ref 150–400)
RBC: 4.69 MIL/uL (ref 4.22–5.81)
RDW: 10.8 % — ABNORMAL LOW (ref 11.5–15.5)
WBC: 8.5 10*3/uL (ref 4.0–10.5)
nRBC: 0 % (ref 0.0–0.2)

## 2019-03-30 LAB — TSH: TSH: 2.269 u[IU]/mL (ref 0.350–4.500)

## 2019-03-30 LAB — CBG MONITORING, ED: Glucose-Capillary: 182 mg/dL — ABNORMAL HIGH (ref 70–99)

## 2019-03-30 LAB — TROPONIN I (HIGH SENSITIVITY): Troponin I (High Sensitivity): 3 ng/L (ref <18)

## 2019-03-30 LAB — LIPASE, BLOOD: Lipase: 22 U/L (ref 11–51)

## 2019-03-30 MED ORDER — SODIUM CHLORIDE (PF) 0.9 % IJ SOLN
INTRAMUSCULAR | Status: AC
  Filled 2019-03-30: qty 50

## 2019-03-30 MED ORDER — IOHEXOL 350 MG/ML SOLN
100.0000 mL | Freq: Once | INTRAVENOUS | Status: AC | PRN
  Administered 2019-03-30: 100 mL via INTRAVENOUS

## 2019-03-30 MED ORDER — SODIUM CHLORIDE 0.9 % IV BOLUS
1000.0000 mL | Freq: Once | INTRAVENOUS | Status: AC
  Administered 2019-03-30: 1000 mL via INTRAVENOUS

## 2019-03-30 NOTE — ED Triage Notes (Signed)
Per pt, states he went for a walk around 12 this afternoon-since getting back from walk he said his heart has been "jumping around a lot", meaning heart rate-states some discomfort in his upper abdominal area

## 2019-03-30 NOTE — ED Notes (Signed)
Pt given urinal and was advised Urine specimen still needed.

## 2019-03-30 NOTE — Discharge Instructions (Signed)
See cardiology for follow up   Stay hydrated   You have lung nodule that needs to be followed up   Your blood sugar is slightly elevated. Continue metformin   See your doctor. Consider following up with cardiology  Return to ER if you have worse palpitations, chest pain, shortness of breath

## 2019-03-30 NOTE — ED Provider Notes (Signed)
[facility] DEPT Provider Note   CSN: [id number] Arrival date & time: 03/30/19  1854     History Chief Complaint  Patient presents with  . elevated heart rate    Peter Mcclure is a 49 y.o. male hx of DM, HTN, here presenting with elevated heart rate, shortness of breath.  Patient states that he has intermittent palpitations for the last several weeks.  Patient states that he was working out today and had sudden worsening shortness of breath after he works out.  He has a watch that showed that it was greater than 150 bpm.  Denies any fevers. Denies any recent travel or history of blood clots   The history is provided by the patient.       Past Medical History:  Diagnosis Date  . Diabetes mellitus without complication ([place])   . Hypertension     Patient Active Problem List   Diagnosis Date Noted  . Hyperlipidemia 10/28/2018  . Obesity (BMI 30-39.9) 01/20/2013  . Diabetes mellitus type 2, uncontrolled ([place]) 01/20/2013  . HEMATOCHEZIA 03/30/2009  . 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 Hypertension 11/13/2008  . GERD 11/13/2008    History reviewed. No pertinent surgical history.     Family History  Problem Relation Age of Onset  . Diabetes Other   . Hypertension Other     Social History   Tobacco Use  . Smoking status: Never Smoker  . Smokeless tobacco: Never Used  Substance Use Topics  . Alcohol use: Yes    Alcohol/week: 4.0 standard drinks    Types: 4 Cans of beer per week    Comment: 4-6 beers a day  . Drug use: No    Home Medications Prior to Admission medications   Medication Sig Start Date End Date Taking? Authorizing Provider  acetaminophen (TYLENOL) 500 MG tablet Take 500 mg by mouth every 6 (six) hours as needed for moderate pain.    [provider]  atorvastatin (LIPITOR) 20 MG tablet Take 1 tablet (20 mg total) by mouth daily. 02/19/19   Burchette, Alinda Sierras, MD  cyclobenzaprine (FLEXERIL) 5 MG tablet  Take 1 tablet (5 mg total) by mouth at bedtime. Patient not taking: Reported on 06/18/2018 04/25/18   Robyn Haber, MD  empagliflozin (JARDIANCE) 10 MG TABS tablet Take 10 mg by mouth daily before breakfast. 02/19/19   Burchette, Alinda Sierras, MD  ibuprofen (ADVIL,MOTRIN) 600 MG tablet Take 1 tablet (600 mg total) by mouth every 6 (six) hours as needed. 11/13/16   Shary Decamp, PA-C  lisinopril (ZESTRIL) 10 MG tablet Take 1 tablet by mouth once daily 03/03/19   Burchette, Alinda Sierras, MD  metFORMIN (GLUCOPHAGE) 500 MG tablet Take 2 tablets by mouth twice daily 02/26/19   Burchette, Alinda Sierras, MD  Omega-3 Fatty Acids (FISH OIL) 1000 MG CAPS Take 1,000 mg by mouth 2 (two) times a week.    [provider]  sildenafil (VIAGRA) 100 MG tablet Take one half to one tablet daily as needed for erectile dysfunction. 02/19/19   Burchette, Alinda Sierras, MD    Allergies    Patient has no known allergies.  Review of Systems   Review of Systems  Cardiovascular: Positive for palpitations.  All other systems reviewed and are negative.   Physical Exam Updated Vital Signs BP (!) 142/94   Pulse (!) 101   Temp 99.5 F (37.5 C) (Oral)   Resp 17   SpO2 98%   Physical Exam Vitals and nursing note reviewed.  HENT:     Head: Normocephalic.     Mouth/Throat:     Mouth: Mucous membranes are moist.  Eyes:     Extraocular Movements: Extraocular movements intact.     Pupils: Pupils are equal, round, and reactive to light.  Cardiovascular:     Rate and Rhythm: Regular rhythm. Tachycardia present.     Pulses: Normal pulses.     Heart sounds: Normal heart sounds.  Pulmonary:     Effort: Pulmonary effort is normal.     Breath sounds: Normal breath sounds.  Abdominal:     General: Abdomen is flat.     Palpations: Abdomen is soft.  Musculoskeletal:        General: Normal range of motion.     Cervical back: Normal range of motion.  Skin:    General: Skin is warm.     Capillary Refill: Capillary refill takes less  than 2 seconds.  Neurological:     General: No focal deficit present.     Mental Status: He is alert and oriented to person, place, and time.  Psychiatric:        Mood and Affect: Mood normal.        Behavior: Behavior normal.     ED Results / Procedures / Treatments   Labs (all labs ordered are listed, but only abnormal results are displayed) Labs Reviewed  COMPREHENSIVE METABOLIC PANEL - Abnormal; Notable for the following components:      Result Value   CO2 19 (*)    Glucose, Bld 200 (*)    Total Protein 8.5 (*)    ALT 55 (*)    Total Bilirubin 1.4 (*)    All other components within normal limits  CBC - Abnormal; Notable for the following components:   RDW 10.8 (*)    All other components within normal limits  CBG MONITORING, ED - Abnormal; Notable for the following components:   Glucose-Capillary 182 (*)    All other components within normal limits  LIPASE, BLOOD  TSH  URINALYSIS, ROUTINE W REFLEX MICROSCOPIC  TROPONIN I (HIGH SENSITIVITY)  TROPONIN I (HIGH SENSITIVITY)    EKG EKG Interpretation  Date/Time:  Sunday March 30 2019 19:05:12 EST Ventricular Rate:  128 PR Interval:    QRS Duration: 97 QT Interval:  312 QTC Calculation: 456 R Axis:   66 Text Interpretation: Sinus tachycardia Baseline wander in lead(s) V4 V6 Since last tracing rate faster Confirmed by Richardean Canal [phone]) on 03/30/2019 8:40:23 PM   Radiology CT Angio Chest PE W and/or Wo Contrast  Result Date: 03/30/2019 CLINICAL DATA:  Chest discomfort. EXAM: CT ANGIOGRAPHY CHEST WITH CONTRAST TECHNIQUE: Multidetector CT imaging of the chest was performed using the standard protocol during bolus administration of intravenous contrast. Multiplanar CT image reconstructions and MIPs were obtained to evaluate the vascular anatomy. CONTRAST:  OMNIPAQUE IOHEXOL 350 MG/ML SOLN COMPARISON:  None. FINDINGS: Cardiovascular: Evaluation is limited by significant respiratory motion artifact.Given this  limitation, no large centrally located pulmonary embolism was detected. Detection of smaller segmental and subsegmental pulmonary emboli is severely limited. The main pulmonary artery is within normal limits for size. There is no CT evidence of acute right heart strain. The visualized aorta is normal. Heart size is normal, without pericardial effusion. Mediastinum/Nodes: --No mediastinal or hilar lymphadenopathy. --No axillary lymphadenopathy. --No supraclavicular lymphadenopathy. --Normal thyroid gland. --The esophagus is unremarkable Lungs/Pleura: There is an 8 mm pulmonary nodule in the anterior right middle lobe (axial series 6, image 68). There  is a 4 mm pulmonary nodule in the left upper lobe with some tethering of the adjacent fissure (axial series 6, image 53). There is a calcified granuloma in the left lower lobe. There is no pneumothorax. There is no pleural effusion. No focal infiltrate. The trachea is unremarkable. Upper Abdomen: There is hepatic steatosis involving the partially visualized liver. Musculoskeletal: There are probable old healed right-sided rib fractures. Review of the MIP images confirms the above findings. IMPRESSION: 1. Evaluation for pulmonary emboli is significantly limited by respiratory motion artifact. Given this limitation, no PE was identified. 2. Bilateral pulmonary nodules measuring up to approximately 8 mm. Non-contrast chest CT at 3-6 months is recommended. If the nodules are stable at time of repeat CT, then future CT at 18-24 months (from today's scan) is considered optional for low-risk patients, but is recommended for high-risk patients. This recommendation follows the consensus statement: Guidelines for Management of Incidental Pulmonary Nodules Detected on CT Images: From the Fleischner Society 2017; Radiology 2017; 284:228-243. 3. Hepatic steatosis. Electronically Signed   By: Katherine Mantle M.D.   On: 03/30/2019 22:20    Procedures Procedures (including  critical care time)  Medications Ordered in ED Medications  sodium chloride (PF) 0.9 % injection (has no administration in time range)  sodium chloride 0.9 % bolus 1,000 mL (1,000 mLs Intravenous New Bag/Given 03/30/19 2111)  iohexol (OMNIPAQUE) 350 MG/ML injection 100 mL (100 mLs Intravenous Contrast Given 03/30/19 2140)    ED Course  I have reviewed the triage vital signs and the nursing notes.  Pertinent labs & imaging results that were available during my care of the patient were reviewed by me and considered in my medical decision making (see chart for details).    MDM Rules/Calculators/A&P                      Dijuan Sleeth is a 49 y.o. male here with palpitations. Consider PE vs hyperthyroidism. Will get labs, CTA chest, TSH.   10:41 PM Labs unremarkable. CTA showed no PE, just nodules that can be followed up. HR down to 100 after IVF. Will refer to cardiology outpatient.   Final Clinical Impression(s) / ED Diagnoses Final diagnoses:  None    Rx / DC Orders ED Discharge Orders    None       Charlynne Pander, MD 03/30/19 2242

## 2019-04-17 ENCOUNTER — Telehealth: Payer: Self-pay | Admitting: Family Medicine

## 2019-04-17 NOTE — Telephone Encounter (Signed)
Spoke with patient and the "tingling has stopped, but he feels weird".  Virtual visit scheduled for 04/18/2019.

## 2019-04-17 NOTE — Telephone Encounter (Signed)
Pt is having a tingling in upper abdomen near heart and would like a call back from nurse to inform in of what he should do about it.

## 2019-04-18 ENCOUNTER — Encounter: Payer: Self-pay | Admitting: Family Medicine

## 2019-04-18 ENCOUNTER — Telehealth (INDEPENDENT_AMBULATORY_CARE_PROVIDER_SITE_OTHER): Payer: Self-pay | Admitting: Family Medicine

## 2019-04-18 ENCOUNTER — Other Ambulatory Visit: Payer: Self-pay

## 2019-04-18 DIAGNOSIS — M791 Myalgia, unspecified site: Secondary | ICD-10-CM

## 2019-04-18 NOTE — Progress Notes (Signed)
This visit type was conducted due to national recommendations for restrictions regarding the COVID-19 pandemic in an effort to limit this patient's exposure and mitigate transmission in our community.   Virtual Visit via Video Note  I connected with Peter Mcclure on 04/18/19 at  7:00 AM EST by a video enabled telemedicine application and verified that I am speaking with the correct person using two identifiers.  Location patient: home Location provider:work or home office Persons participating in the virtual visit: patient, provider  I discussed the limitations of evaluation and management by telemedicine and the availability of in person appointments. The patient expressed understanding and agreed to proceed.   HPI: Peter Mcclure complains of some diffuse myalgias which apparently gone on for months.  He thinks these are slightly worse in the mornings.  He is complaining of myalgias greater than arthralgias.  He also describes some very transient "tingling "symptoms upper abdomen which lasted just several minutes on a couple episodes recently.  No consistent dysesthesias.  Denies muscle cramps.  Does take Lipitor.  He remembers stopping this once before but is not sure this had much impact on myalgias.  He gets Covid testing weekly through work and this is consistently been negative.  No recent fever.  No chills.  Poor sleep quality.  He has consumed alcohol fairly regularly but is recently scaling back some.  Recent TSH normal.   ROS: See pertinent positives and negatives per HPI.  Past Medical History:  Diagnosis Date  . Diabetes mellitus without complication ([facility])   . Hypertension     No past surgical history on file.  Family History  Problem Relation Age of Onset  . Diabetes Other   . Hypertension Other     SOCIAL HX: Non-smoker   Current Outpatient Medications:  .  acetaminophen (TYLENOL) 500 MG tablet, Take 500 mg by mouth every 6 (six) hours as needed for moderate pain., Disp: ,  Rfl:  .  atorvastatin (LIPITOR) 20 MG tablet, Take 1 tablet (20 mg total) by mouth daily., Disp: 30 tablet, Rfl: 11 .  cyclobenzaprine (FLEXERIL) 5 MG tablet, Take 1 tablet (5 mg total) by mouth at bedtime., Disp: 7 tablet, Rfl: 0 .  empagliflozin (JARDIANCE) 10 MG TABS tablet, Take 10 mg by mouth daily before breakfast., Disp: 30 tablet, Rfl: 11 .  ibuprofen (ADVIL,MOTRIN) 600 MG tablet, Take 1 tablet (600 mg total) by mouth every 6 (six) hours as needed., Disp: 30 tablet, Rfl: 0 .  lisinopril (ZESTRIL) 10 MG tablet, Take 1 tablet by mouth once daily, Disp: 90 tablet, Rfl: 0 .  metFORMIN (GLUCOPHAGE) 500 MG tablet, Take 2 tablets by mouth twice daily, Disp: 360 tablet, Rfl: 1 .  Omega-3 Fatty Acids (FISH OIL) 1000 MG CAPS, Take 1,000 mg by mouth 2 (two) times a week., Disp: , Rfl:  .  sildenafil (VIAGRA) 100 MG tablet, Take one half to one tablet daily as needed for erectile dysfunction., Disp: 10 tablet, Rfl: 11  EXAM:  VITALS per patient if applicable:  GENERAL: alert, oriented, appears well and in no acute distress  HEENT: atraumatic, conjunttiva clear, no obvious abnormalities on inspection of external nose and ears  NECK: normal movements of the head and neck  LUNGS: on inspection no signs of respiratory distress, breathing rate appears normal, no obvious gross SOB, gasping or wheezing  CV: no obvious cyanosis  MS: moves all visible extremities without noticeable abnormality  PSYCH/NEURO: pleasant and cooperative, no obvious depression or anxiety, speech and thought processing grossly  intact  ASSESSMENT AND PLAN:  Discussed the following assessment and plan:  Diffuse myalgias.  Question related to statin.  Previous CK level and TSH has been normal.  Etiology unclear  -We suggested leaving off his Lipitor for several weeks to see if this has an impact.  If this does not we recommend office follow-up to assess further. -Discussed importance of adequate hydration and also  getting adequate sleep -Strongly advocated that he try to scale back his alcohol use hopefully to help his sleep quality     I discussed the assessment and treatment plan with the patient. The patient was provided an opportunity to ask questions and all were answered. The patient agreed with the plan and demonstrated an understanding of the instructions.   The patient was advised to call back or seek an in-person evaluation if the symptoms worsen or if the condition fails to improve as anticipated.     Carolann Littler, MD

## 2019-05-19 ENCOUNTER — Other Ambulatory Visit: Payer: Self-pay

## 2019-05-20 ENCOUNTER — Ambulatory Visit: Payer: Self-pay | Admitting: Family Medicine

## 2019-05-20 ENCOUNTER — Telehealth: Payer: Self-pay | Admitting: *Deleted

## 2019-05-20 DIAGNOSIS — Z0289 Encounter for other administrative examinations: Secondary | ICD-10-CM

## 2019-05-20 NOTE — Telephone Encounter (Signed)
Forwarding to our Research officer, political party

## 2019-05-20 NOTE — Telephone Encounter (Signed)
Patient called after hours line this morning. Caller wants to cancel his appointment for 05/20/19 at 8 am. Office hours were provided.

## 2019-05-20 NOTE — Telephone Encounter (Signed)
Peter Mcclure, this patient has scheduled and cancelled appointments several times within the past year. Are you able to send him a letter to let him know that this is not acceptable and to please keep future appointments to prevent disciplinary action? Not sure how to address this? Can you help? Thank you!

## 2019-05-20 NOTE — Telephone Encounter (Signed)
Please see message. Not sure if you need for Asher Muir to send out a letter about multiple canceling and rescheduling of appointments?

## 2019-05-20 NOTE — Telephone Encounter (Signed)
yes

## 2019-05-21 NOTE — Telephone Encounter (Signed)
I will investigate.

## 2019-06-05 ENCOUNTER — Other Ambulatory Visit: Payer: Self-pay

## 2019-06-06 ENCOUNTER — Encounter: Payer: Self-pay | Admitting: Family Medicine

## 2019-06-06 ENCOUNTER — Ambulatory Visit (INDEPENDENT_AMBULATORY_CARE_PROVIDER_SITE_OTHER): Payer: Self-pay | Admitting: Family Medicine

## 2019-06-06 VITALS — BP 126/78 | HR 72 | Temp 97.9°F | Wt 251.5 lb

## 2019-06-06 DIAGNOSIS — K219 Gastro-esophageal reflux disease without esophagitis: Secondary | ICD-10-CM

## 2019-06-06 DIAGNOSIS — E1165 Type 2 diabetes mellitus with hyperglycemia: Secondary | ICD-10-CM

## 2019-06-06 DIAGNOSIS — R140 Abdominal distension (gaseous): Secondary | ICD-10-CM

## 2019-06-06 LAB — HEMOGLOBIN A1C: Hemoglobin A1C: 7.3

## 2019-06-06 MED ORDER — PANTOPRAZOLE SODIUM 40 MG PO TBEC: 40.0000 mg | DELAYED_RELEASE_TABLET | Freq: Every day | ORAL | 3 refills | Status: DC

## 2019-06-06 NOTE — Progress Notes (Signed)
Subjective:     Patient ID: Peter Mcclure, male   DOB: December 07, 1970, 49 y.o.   MRN: [id number]  HPI Peter Mcclure complained initially of some "heartburn".  On further description he is having diffuse abdominal bloating which is almost daily and has been going on for several months.  He cannot relate this to any specific foods.  No known history of lactose tolerance though he tends to avoid lactose-containing foods.  No known history of gluten sensitivity.  Appetite is normal.  No nausea or vomiting.  No stool changes.  He tried over-the-counter Pepcid and Nexium without much improvement.  He does have some occasional heartburn symptoms.  No melena.  He had CBC back in January which was normal.  Type 2 diabetes.  Last A1c was 7.7%.  Up until about 3 weeks ago he was exercising with treadmill 2 to 3 miles per day.  Not monitoring blood sugars regularly.  We prescribed Jardiance but he cannot take because of cost.  He does remain on Metformin.  He does have some mild paresthesias of extremities upper and lower.  Previous TSH back in January normal  Past Medical History:  Diagnosis Date  . Diabetes mellitus without complication ([place])   . Hypertension    No past surgical history on file.  reports that he has never smoked. He has never used smokeless tobacco. He reports current alcohol use of about 4.0 standard drinks of alcohol per week. He reports that he does not use drugs. family history includes Diabetes in an other family member; Hypertension in an other family member. No Known Allergies   Review of Systems  Constitutional: Negative for appetite change, chills, fever and unexpected weight change.  Respiratory: Negative for cough and shortness of breath.   Cardiovascular: Negative for chest pain, palpitations and leg swelling.  Gastrointestinal: Positive for abdominal pain. Negative for blood in stool, constipation, diarrhea, nausea and vomiting.  Genitourinary: Negative for dysuria.  Neurological:  Positive for numbness. Negative for dizziness, seizures, syncope, speech difficulty and weakness.       Objective:   Physical Exam Vitals reviewed.  Constitutional:      Appearance: Normal appearance.  Cardiovascular:     Rate and Rhythm: Normal rate and regular rhythm.  Pulmonary:     Effort: Pulmonary effort is normal.     Breath sounds: Normal breath sounds.  Abdominal:     General: Bowel sounds are normal. There is no distension.     Palpations: Abdomen is soft. There is no mass.     Tenderness: There is no abdominal tenderness. There is no guarding or rebound.     Hernia: No hernia is present.  Musculoskeletal:     Right lower leg: No edema.     Left lower leg: No edema.  Neurological:     General: No focal deficit present.     Mental Status: He is alert.     Cranial Nerves: No cranial nerve deficit.        Assessment:     #1 type 2 diabetes slightly improved with A1c 7.3%.  He has had difficulty with coverage with medications and currently is only taking Metformin.  Cannot afford taking Jardiance  #2 diffuse abdominal bloating and GERD symptoms.  He denies any red flags such as weight loss, appetite loss, stool changes, etc. sounds like some of his bloating symptoms are related to gas    Plan:     -Discussed options for diabetes management.  He would like to given her  3 months of lifestyle management and see if we can continue driving down X2O without further medications.  We may have to look at low-dose if on diarrhea if not further goal in 3 months because of cost  -Discussed low FODMAP diet  -Discussed trial of Protonix 40 mg once daily.  He will also consider over-the-counter probiotic daily  -If abdominal symptoms not improving in 2 weeks patient to let me know  -If improved will consider tapering off Protonix within couple months  [name] MD [facility]

## 2019-06-06 NOTE — Patient Instructions (Signed)
Low-FODMAP Eating Plan  FODMAPs (fermentable oligosaccharides, disaccharides, monosaccharides, and polyols) are sugars that are hard for some people to digest. A low-FODMAP eating plan may help some people who have bowel (intestinal) diseases to manage their symptoms. This meal plan can be complicated to follow. Work with a diet and nutrition specialist (dietitian) to make a low-FODMAP eating plan that is right for you. A dietitian can make sure that you get enough nutrition from this diet. What are tips for following this plan? Reading food labels  Check labels for hidden FODMAPs such as: ? High-fructose syrup. ? Honey. ? Agave. ? Natural fruit flavors. ? Onion or garlic powder.  Choose low-FODMAP foods that contain 3-4 grams of fiber per serving.  Check food labels for serving sizes. Eat only one serving at a time to make sure FODMAP levels stay low. Meal planning  Follow a low-FODMAP eating plan for up to 6 weeks, or as told by your health care provider or dietitian.  To follow the eating plan: 1. Eliminate high-FODMAP foods from your diet completely. 2. Gradually reintroduce high-FODMAP foods into your diet one at a time. Most people should wait a few days after introducing one high-FODMAP food before they introduce the next high-FODMAP food. Your dietitian can recommend how quickly you may reintroduce foods. 3. Keep a daily record of what you eat and drink, and make note of any symptoms that you have after eating. 4. Review your daily record with a dietitian regularly. Your dietitian can help you identify which foods you can eat and which foods you should avoid. General tips  Drink enough fluid each day to keep your urine pale yellow.  Avoid processed foods. These often have added sugar and may be high in FODMAPs.  Avoid most dairy products, whole grains, and sweeteners.  Work with a dietitian to make sure you get enough fiber in your diet. Recommended  foods Grains  Gluten-free grains, such as rice, oats, buckwheat, quinoa, corn, polenta, and millet. Gluten-free pasta, bread, or cereal. Rice noodles. Corn tortillas. Vegetables  Eggplant, zucchini, cucumber, peppers, green beans, Brussels sprouts, bean sprouts, lettuce, arugula, kale, Swiss chard, spinach, collard greens, bok choy, summer squash, potato, and tomato. Limited amounts of corn, carrot, and sweet potato. Green parts of scallions. Fruits  Bananas, oranges, lemons, limes, blueberries, raspberries, strawberries, grapes, cantaloupe, honeydew melon, kiwi, papaya, passion fruit, and pineapple. Limited amounts of dried cranberries, banana chips, and shredded coconut. Dairy  Lactose-free milk, yogurt, and kefir. Lactose-free cottage cheese and ice cream. Non-dairy milks, such as almond, coconut, hemp, and rice milk. Yogurts made of non-dairy milks. Limited amounts of goat cheese, brie, mozzarella, parmesan, swiss, and other hard cheeses. Meats and other protein foods  Unseasoned beef, pork, poultry, or fish. Eggs. Bacon. Tofu (firm) and tempeh. Limited amounts of nuts and seeds, such as almonds, walnuts, brazil nuts, pecans, peanuts, pumpkin seeds, chia seeds, and sunflower seeds. Fats and oils  Butter-free spreads. Vegetable oils, such as olive, canola, and sunflower oil. Seasoning and other foods  Artificial sweeteners with names that do not end in "ol" such as aspartame, saccharine, and stevia. Maple syrup, white table sugar, raw sugar, brown sugar, and molasses. Fresh basil, coriander, parsley, rosemary, and thyme. Beverages  Water and mineral water. Sugar-sweetened soft drinks. Small amounts of orange juice or cranberry juice. Black and green tea. Most dry wines. Coffee. This may not be a complete list of low-FODMAP foods. Talk with your dietitian for more information. Foods to avoid Grains  Wheat,   including kamut, durum, and semolina. Barley and bulgur. Couscous. Wheat-based  cereals. Wheat noodles, bread, crackers, and pastries. Vegetables  Chicory root, artichoke, asparagus, cabbage, snow peas, sugar snap peas, mushrooms, and cauliflower. Onions, garlic, leeks, and the white part of scallions. Fruits  Fresh, dried, and juiced forms of apple, pear, watermelon, peach, plum, cherries, apricots, blackberries, boysenberries, figs, nectarines, and mango. Avocado. Dairy  Milk, yogurt, ice cream, and soft cheese. Cream and sour cream. Milk-based sauces. Custard. Meats and other protein foods  Fried or fatty meat. Sausage. Cashews and pistachios. Soybeans, baked beans, black beans, chickpeas, kidney beans, fava beans, navy beans, lentils, and split peas. Seasoning and other foods  Any sugar-free gum or candy. Foods that contain artificial sweeteners such as sorbitol, mannitol, isomalt, or xylitol. Foods that contain honey, high-fructose corn syrup, or agave. Bouillon, vegetable stock, beef stock, and chicken stock. Garlic and onion powder. Condiments made with onion, such as hummus, chutney, pickles, relish, salad dressing, and salsa. Tomato paste. Beverages  Chicory-based drinks. Coffee substitutes. Chamomile tea. Fennel tea. Sweet or fortified wines such as port or sherry. Diet soft drinks made with isomalt, mannitol, maltitol, sorbitol, or xylitol. Apple, pear, and mango juice. Juices with high-fructose corn syrup. This may not be a complete list of high-FODMAP foods. Talk with your dietitian to discuss what dietary choices are best for you.  Summary  A low-FODMAP eating plan is a short-term diet that eliminates FODMAPs from your diet to help ease symptoms of certain bowel diseases.  The eating plan usually lasts up to 6 weeks. After that, high-FODMAP foods are restarted gradually, one at a time, so you can find out which may be causing symptoms.  A low-FODMAP eating plan can be complicated. It is best to work with a dietitian who has experience with this type of  plan. This information is not intended to replace advice given to you by your health care provider. Make sure you discuss any questions you have with your health care provider. Document Revised: 02/09/2017 Document Reviewed: 10/24/2016 Elsevier Patient Education  2020 [employer]  Start Protonix 40 mg once daily  Consider OTC probiotic once daily  Let me know in two weeks  If not improving with the above.

## 2019-06-13 ENCOUNTER — Other Ambulatory Visit: Payer: Self-pay | Admitting: Family Medicine

## 2019-07-17 ENCOUNTER — Other Ambulatory Visit: Payer: Self-pay

## 2019-07-18 ENCOUNTER — Encounter: Payer: Self-pay | Admitting: Family Medicine

## 2019-07-18 ENCOUNTER — Ambulatory Visit (INDEPENDENT_AMBULATORY_CARE_PROVIDER_SITE_OTHER): Payer: Self-pay | Admitting: Family Medicine

## 2019-07-18 VITALS — BP 130/74 | HR 85 | Temp 97.7°F | Wt 250.8 lb

## 2019-07-18 DIAGNOSIS — R140 Abdominal distension (gaseous): Secondary | ICD-10-CM

## 2019-07-18 DIAGNOSIS — R109 Unspecified abdominal pain: Secondary | ICD-10-CM

## 2019-07-18 LAB — POCT URINALYSIS DIPSTICK
Bilirubin, UA: NEGATIVE
Blood, UA: NEGATIVE
Glucose, UA: NEGATIVE
Leukocytes, UA: NEGATIVE
Nitrite, UA: NEGATIVE
Protein, UA: NEGATIVE
Spec Grav, UA: 1.02 (ref 1.010–1.025)
Urobilinogen, UA: 0.2 E.U./dL
pH, UA: 6 (ref 5.0–8.0)

## 2019-07-18 NOTE — Patient Instructions (Signed)
Try the probiotic  If symptoms of abdominal bloating not improved by follow-up we might want to switch to extended release Metformin.

## 2019-07-18 NOTE — Progress Notes (Signed)
  Subjective:     Patient ID: Peter Mcclure, male   DOB: 1971/02/04, 49 y.o.   MRN: [id number]  HPI   Peter Mcclure is seen with sensation of "tightness "around flank area bilaterally.  This seems to spread anterior bilaterally he has some intermittent bloating.  Symptoms present for a few months.  We have given information on low FODMAP diet.  Still has a lot of gas symptoms.  No constipation.  No urinary symptoms.  No nausea or vomiting.  No appetite or weight changes.  No pain.  No fever.  Recent CBC unremarkable.  No relief with antacid.    Type 2 diabetes.  Last A1c 7.3% and gradually improving.  He was unable to start Jardiance because of cost issues.  He remains on Metformin twice daily  Past Medical History:  Diagnosis Date  . Diabetes mellitus without complication (HCC)   . Hypertension    No past surgical history on file.  reports that he has never smoked. He has never used smokeless tobacco. He reports current alcohol use of about 4.0 standard drinks of alcohol per week. He reports that he does not use drugs. family history includes Diabetes in an other family member; Hypertension in an other family member. No Known Allergies   Wt Readings from Last 3 Encounters:  07/18/19 250 lb 12.8 oz (113.8 kg)  06/06/19 251 lb 8 oz (114.1 kg)  02/19/19 248 lb 11.2 oz (112.8 kg)      Review of Systems  Constitutional: Negative for appetite change, chills, fever and unexpected weight change.  Respiratory: Negative for cough and shortness of breath.   Cardiovascular: Negative for chest pain.  Gastrointestinal: Negative for blood in stool, constipation, diarrhea, nausea and vomiting.  Genitourinary: Positive for flank pain. Negative for difficulty urinating, dysuria and hematuria.  Neurological: Negative for dizziness.       Objective:   Physical Exam Vitals reviewed.  Constitutional:      Appearance: Normal appearance.  Cardiovascular:     Rate and Rhythm: Normal rate and regular rhythm.   Pulmonary:     Effort: Pulmonary effort is normal.     Breath sounds: Normal breath sounds.  Abdominal:     General: Bowel sounds are normal.     Palpations: Abdomen is soft. There is no mass.     Tenderness: There is no abdominal tenderness. There is no guarding or rebound.  Neurological:     Mental Status: He is alert.        Assessment:     Intermittent abdominal bloating.  He does not have any red flags such as appetite change, weight loss, fever, localizing pain.  Question gaseous.  Question related to Metformin.  He is also describing some bilateral flank symptoms and not clear if this is related.  He is not really describing any significant pain aspect.    Plan:     -Check urine dipstick=normal - will discussed possible change of Metformin to extended release and he declines at this time.   He wishes to observe for one more month.  [name] MD [facility] at [facility]

## 2019-07-19 ENCOUNTER — Other Ambulatory Visit: Payer: Self-pay

## 2019-07-19 ENCOUNTER — Encounter (HOSPITAL_COMMUNITY): Payer: Self-pay

## 2019-07-19 ENCOUNTER — Emergency Department (HOSPITAL_COMMUNITY)
Admission: EM | Admit: 2019-07-19 | Discharge: 2019-07-19 | Disposition: A | Discharge status: Home / Self Care | Payer: Self-pay | Attending: Emergency Medicine | Admitting: Emergency Medicine

## 2019-07-19 DIAGNOSIS — E119 Type 2 diabetes mellitus without complications: Secondary | ICD-10-CM | POA: Insufficient documentation

## 2019-07-19 DIAGNOSIS — R002 Palpitations: Secondary | ICD-10-CM | POA: Insufficient documentation

## 2019-07-19 DIAGNOSIS — I10 Essential (primary) hypertension: Secondary | ICD-10-CM | POA: Insufficient documentation

## 2019-07-19 LAB — COMPREHENSIVE METABOLIC PANEL
ALT: 46 U/L — ABNORMAL HIGH (ref 0–44)
AST: 28 U/L (ref 15–41)
Albumin: 4.1 g/dL (ref 3.5–5.0)
Alkaline Phosphatase: 81 U/L (ref 38–126)
Anion gap: 10 (ref 5–15)
BUN: 13 mg/dL (ref 6–20)
CO2: 22 mmol/L (ref 22–32)
Calcium: 8.7 mg/dL — ABNORMAL LOW (ref 8.9–10.3)
Chloride: 104 mmol/L (ref 98–111)
Creatinine, Ser: 0.7 mg/dL (ref 0.61–1.24)
GFR calc Af Amer: >60 mL/min (ref >60)
GFR calc non Af Amer: >60 mL/min (ref >60)
Glucose, Bld: 225 mg/dL — ABNORMAL HIGH (ref 70–99)
Potassium: 4.1 mmol/L (ref 3.5–5.1)
Sodium: 136 mmol/L (ref 135–145)
Total Bilirubin: 0.8 mg/dL (ref 0.3–1.2)
Total Protein: 7.2 g/dL (ref 6.5–8.1)

## 2019-07-19 LAB — CBC WITH DIFFERENTIAL/PLATELET
Abs Immature Granulocytes: 0.02 10*3/uL (ref 0.00–0.07)
Basophils Absolute: 0 10*3/uL (ref 0.0–0.1)
Basophils Relative: 1 %
Eosinophils Absolute: 0.1 10*3/uL (ref 0.0–0.5)
Eosinophils Relative: 2 %
HCT: 39.5 % (ref 39.0–52.0)
Hemoglobin: 13.3 g/dL (ref 13.0–17.0)
Immature Granulocytes: 0 %
Lymphocytes Relative: 24 %
Lymphs Abs: 1.5 10*3/uL (ref 0.7–4.0)
MCH: 31.9 pg (ref 26.0–34.0)
MCHC: 33.7 g/dL (ref 30.0–36.0)
MCV: 94.7 fL (ref 80.0–100.0)
Monocytes Absolute: 0.7 10*3/uL (ref 0.1–1.0)
Monocytes Relative: 11 %
Neutro Abs: 3.9 10*3/uL (ref 1.7–7.7)
Neutrophils Relative %: 62 %
Platelets: 261 10*3/uL (ref 150–400)
RBC: 4.17 MIL/uL — ABNORMAL LOW (ref 4.22–5.81)
RDW: 11 % — ABNORMAL LOW (ref 11.5–15.5)
WBC: 6.2 10*3/uL (ref 4.0–10.5)
nRBC: 0 % (ref 0.0–0.2)

## 2019-07-19 LAB — MAGNESIUM: Magnesium: 1.8 mg/dL (ref 1.7–2.4)

## 2019-07-19 LAB — TSH: TSH: 1.841 u[IU]/mL (ref 0.350–4.500)

## 2019-07-19 MED ORDER — SODIUM CHLORIDE 0.9 % IV BOLUS
1000.0000 mL | Freq: Once | INTRAVENOUS | Status: AC
  Administered 2019-07-19: 1000 mL via INTRAVENOUS

## 2019-07-19 NOTE — ED Notes (Signed)
ED Provider at bedside. 

## 2019-07-19 NOTE — ED Triage Notes (Signed)
Pt sts heart rate from 99- 130 at home. Wanted to get evaluated to make sure heart is ok.

## 2019-07-19 NOTE — ED Provider Notes (Signed)
Emergency Department Provider Note   I have reviewed the triage vital signs and the nursing notes.   HISTORY  Chief Complaint Tachycardia   HPI Peter Mcclure is a 49 y.o. male with PMH of DM and HTN presents to the emergency department for evaluation of heart palpitations.  He has had these occasionally in the past but has become more persistent over the past 2 to 3 days.  He exercises daily and feels like his heart rate stays elevated even after his exercises completed.  Last night he awoke with feeling like his heart was racing at approximately 1 AM.  He continued to have symptoms for 1 to 2 hours.  He denies chest pain or shortness of breath.  He has tried drinking fluids and resting but got no relief.  He does wear a watch which also monitors his heart rate and is checking to see if it keeps a record of his heart tracing.  He saw his primary care doctor yesterday with normal vitals.  He mentioned that at that time but the plan as reported by the patient was watch and wait.  He has started a new dietary supplement which he states is for digestion.  He is not sure what is in the supplement but did started just 2 to 3 days ago.   Past Medical History:  Diagnosis Date  . Diabetes mellitus without complication (HCC)   . Hypertension     Patient Active Problem List   Diagnosis Date Noted  . Hyperlipidemia 10/28/2018  . Obesity (BMI 30-39.9) 01/20/2013  . Diabetes mellitus type 2, uncontrolled (HCC) 01/20/2013  . HEMATOCHEZIA 03/30/2009  . 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 Hypertension 11/13/2008  . GERD 11/13/2008    History reviewed. No pertinent surgical history.  Allergies Patient has no known allergies.  Family History  Problem Relation Age of Onset  . Diabetes Other   . Hypertension Other     Social History Social History   Tobacco Use  . Smoking status: Never Smoker  . Smokeless tobacco: Never Used  Substance Use Topics  . Alcohol use: Yes   Alcohol/week: 4.0 standard drinks    Types: 4 Cans of beer per week    Comment: 4-6 beers a day  . Drug use: No    Review of Systems  Constitutional: No fever/chills Eyes: No visual changes. ENT: No sore throat. Cardiovascular: Denies chest pain. Positive palpitations.  Respiratory: Denies shortness of breath. Gastrointestinal: No abdominal pain.  No nausea, no vomiting.  No diarrhea.  No constipation. Genitourinary: Negative for dysuria. Musculoskeletal: Negative for back pain. Skin: Negative for rash. Neurological: Negative for headaches, focal weakness or numbness.  10-point ROS otherwise negative.  ____________________________________________   PHYSICAL EXAM:  VITAL SIGNS: ED Triage Vitals  Enc Vitals Group     BP 07/19/19 0406 (!) 169/99     Pulse Rate 07/19/19 0406 (!) 105     Resp 07/19/19 0406 18     Temp 07/19/19 0406 98.1 F (36.7 C)     Temp Source 07/19/19 0406 Oral     SpO2 07/19/19 0406 99 %     Weight 07/19/19 0407 250 lb (113.4 kg)     Height 07/19/19 0407 6\' 3"  (1.905 m)   Constitutional: Alert and oriented. Well appearing and in no acute distress. Eyes: Conjunctivae are normal.  Head: Atraumatic. Nose: No congestion/rhinnorhea. Mouth/Throat: Mucous membranes are moist. Neck: No stridor.  Cardiovascular: Normal rate, regular rhythm. Good peripheral circulation. Grossly normal heart  sounds.   Respiratory: Normal respiratory effort.  No retractions. Lungs CTAB. Gastrointestinal: Soft and nontender. No distention.  Musculoskeletal: No gross deformities of extremities. Neurologic:  Normal speech and language.  Skin:  Skin is warm, dry and intact. No rash noted.  ____________________________________________   LABS (all labs ordered are listed, but only abnormal results are displayed)  Labs Reviewed  COMPREHENSIVE METABOLIC PANEL - Abnormal; Notable for the following components:      Result Value   Glucose, Bld 225 (*)    Calcium 8.7 (*)    ALT  46 (*)    All other components within normal limits  CBC WITH DIFFERENTIAL/PLATELET - Abnormal; Notable for the following components:   RBC 4.17 (*)    RDW 11.0 (*)    All other components within normal limits  MAGNESIUM  TSH   ____________________________________________  EKG   EKG Interpretation  Date/Time:  Saturday Jul 19 2019 04:09:15 EDT Ventricular Rate:  100 PR Interval:    QRS Duration: 105 QT Interval:  342 QTC Calculation: 442 R Axis:   55 Text Interpretation: Sinus tachycardia ST elev, probable normal early repol pattern No STEMI Confirmed by Nanda Quinton [phone]) on 07/19/2019 7:04:39 AM       ____________________________________________  RADIOLOGY  None  ____________________________________________   PROCEDURES  Procedure(s) performed:   Procedures  None  ____________________________________________   INITIAL IMPRESSION / ASSESSMENT AND PLAN / ED COURSE  Pertinent labs & imaging results that were available during my care of the patient were reviewed by me and considered in my medical decision making (see chart for details).   Patient presents emergency department for evaluation of heart palpitations.  Normal heart rate here.  EKG interpreted as above.  No chest pain, shortness of breath, or other features to suspect PE or ACS.  Plan for electrolytes, TSH, IV fluids.  Patient may benefit from stopping his dietary supplement as the worsening symptoms do seem to correlate with when he started the supplement.  I do not know what is in the supplement, however.  Patient may also benefit from cardiology follow-up to consider ambulatory monitoring.   09:00 AM  Labs reviewed and patient reassessed.  He was able to find the heart tracings from his wristwatch which show what appears to be a sinus rhythm.  This is not completely reliable and I spoke with the patient regarding this.  No arrhythmia while on telemetry here.  Labs show hyperglycemia without evidence of  DKA.  Patient skipped his diabetes medication last night and will restart it today.  Plan for close PCP follow-up and also listed cardiology contact information for follow-up.  Discussed ED return precautions in detail.  Patient feeling improved after IV fluids here.  ____________________________________________  FINAL CLINICAL IMPRESSION(S) / ED DIAGNOSES  Final diagnoses:  Palpitations    MEDICATIONS GIVEN DURING THIS VISIT:  Medications  sodium chloride 0.9 % bolus 1,000 mL (1,000 mLs Intravenous New Bag/Given 07/19/19 0727)    Note:  This document was prepared using Dragon voice recognition software and may include unintentional dictation errors.  Nanda Quinton, MD, [facility]    Delon Revelo, [facility], MD 07/19/19 [phone]

## 2019-07-19 NOTE — Discharge Instructions (Signed)
You were seen in the emergency room today with heart palpitations.  Your watch did not show an obvious abnormal rhythm but this is not a completely reliable test.  Your blood work here was normal.  Please start taking her diabetes medications again as your blood sugar was slightly high.  I like for you to follow with your primary care doctor for further evaluation and I have listed the name of the cardiology office on your paperwork to call for follow-up appointment.  If your symptoms suddenly worsen, he develop chest pain, shortness of breath, passing out, other severe symptoms he should return to the emergency department immediately.

## 2019-07-24 ENCOUNTER — Telehealth: Payer: Self-pay | Admitting: *Deleted

## 2019-07-24 NOTE — Telephone Encounter (Signed)
Patient spoke with nurse triage on 07/23/2019. Patient reports he  had an apt with Doctor last Friday. Saturday he went to ER with a very fast heart rate. His rate is up to 100. He is not doing anything but his heart rate just goes up. BP 150/87 pulse rate 102.  Patient reports BP and HR have been fluctuating for last few days. Patient was advised her needs to see PCP within 2 weeks

## 2019-07-25 NOTE — Telephone Encounter (Signed)
Set him up to see [place] in next several days (by early next week).

## 2019-07-25 NOTE — Telephone Encounter (Signed)
Spoke with the pt and scheduled an appt for 5/18 to arrive at 8:15am.

## 2019-07-28 ENCOUNTER — Other Ambulatory Visit: Payer: Self-pay

## 2019-07-29 ENCOUNTER — Ambulatory Visit (INDEPENDENT_AMBULATORY_CARE_PROVIDER_SITE_OTHER): Payer: Self-pay | Admitting: Family Medicine

## 2019-07-29 ENCOUNTER — Encounter: Payer: Self-pay | Admitting: Family Medicine

## 2019-07-29 ENCOUNTER — Telehealth: Payer: Self-pay | Admitting: Radiology

## 2019-07-29 VITALS — BP 118/68 | HR 82 | Temp 97.8°F | Wt 245.0 lb

## 2019-07-29 DIAGNOSIS — R002 Palpitations: Secondary | ICD-10-CM

## 2019-07-29 MED ORDER — METOPROLOL SUCCINATE ER 25 MG PO TB24: ORAL_TABLET | ORAL | 3 refills | Status: DC

## 2019-07-29 NOTE — Telephone Encounter (Addendum)
Enrolled patient for a 30 day [employer] Event monitor to be mailed to patients home. Brief instructions were gone over with the patient and he knows to expect the monitor to arrive in 5-7 days.  

## 2019-07-29 NOTE — Patient Instructions (Signed)

## 2019-07-29 NOTE — Progress Notes (Signed)
Subjective:     Patient ID: Peter Mcclure, male   DOB: Jun 13, 1970, 49 y.o.   MRN: [id number]  HPI   Peter Mcclure has history of hypertension, type 2 diabetes, GERD, hyperlipidemia.  He has had several months of increased intermittent palpitations.  States that baseline his heart rate is usually 70-90.  This is at rest.  He recent went to the ER on the eighth for evaluation of palpitations.  He had had had those for 2 or 3 days prior to admission intermittently.  He woke up around 1 AM the night before that visit with palpitations and then sitting at rest.   Recurrent palpitations at rest prompting visit to the ER .  There was no dizziness or syncope.  No chest pain.  No exertional symptoms.  He states he does not take any supplements.  He has all but limited caffeine and has had no alcohol recently.  He has lost some modest weight recently but possibly due to dietary changes.  Recent TSH which was normal.  Last A1c 7.3%  We ordered event monitor and stress test during the past year but do those things were not scheduled or he did not go.  Past Medical History:  Diagnosis Date  . Diabetes mellitus without complication (HCC)   . Hypertension    No past surgical history on file.  reports that he has never smoked. He has never used smokeless tobacco. He reports current alcohol use of about 4.0 standard drinks of alcohol per week. He reports that he does not use drugs. family history includes Diabetes in an other family member; Hypertension in an other family member. No Known Allergies  Wt Readings from Last 3 Encounters:  07/29/19 245 lb (111.1 kg)  07/19/19 250 lb (113.4 kg)  07/18/19 250 lb 12.8 oz (113.8 kg)     Review of Systems  Constitutional: Negative for fatigue.  Eyes: Negative for visual disturbance.  Respiratory: Negative for cough, chest tightness and shortness of breath.   Cardiovascular: Positive for palpitations. Negative for chest pain and leg swelling.  Gastrointestinal: Negative  for abdominal pain.  Neurological: Negative for dizziness, syncope, speech difficulty, weakness, light-headedness and headaches.       Objective:   Physical Exam Vitals reviewed.  Constitutional:      Appearance: Normal appearance.  Cardiovascular:     Rate and Rhythm: Normal rate and regular rhythm.     Heart sounds: No murmur. No gallop.   Pulmonary:     Effort: Pulmonary effort is normal.     Breath sounds: Normal breath sounds. No wheezing or rales.  Musculoskeletal:     Cervical back: Neck supple.     Right lower leg: No edema.     Left lower leg: No edema.  Lymphadenopathy:     Cervical: No cervical adenopathy.  Neurological:     Mental Status: He is alert.        Assessment:     Intermittent palpitations.  These have been going on for quite some time.  He does not have any red flags such as associated chest pains, dizziness, syncope.  Recent thyroid function was normal.  Recent EKG unremarkable.  By his apple watch his rate frequently goes from 90 to around 120 and sometimes last several minutes.  We recommended the following    Plan:     -Schedule cardiac event monitor to rule out any arrhythmias -Set up echocardiogram to further assess -We wrote for low-dose Toprol-XL 25 mg 1 daily to use  as needed for increased palpitations. -He has follow-up in June and will keep that appointment  Eulas Post MD [facility] at [facility]

## 2019-08-20 ENCOUNTER — Other Ambulatory Visit (HOSPITAL_COMMUNITY): Payer: Self-pay

## 2019-09-04 ENCOUNTER — Other Ambulatory Visit: Payer: Self-pay

## 2019-09-04 ENCOUNTER — Encounter (INDEPENDENT_AMBULATORY_CARE_PROVIDER_SITE_OTHER): Payer: Self-pay

## 2019-09-04 DIAGNOSIS — R002 Palpitations: Secondary | ICD-10-CM

## 2019-09-05 ENCOUNTER — Encounter: Payer: Self-pay | Admitting: Family Medicine

## 2019-09-05 ENCOUNTER — Ambulatory Visit (INDEPENDENT_AMBULATORY_CARE_PROVIDER_SITE_OTHER): Payer: Self-pay | Admitting: Family Medicine

## 2019-09-05 VITALS — BP 120/68 | HR 60 | Temp 97.6°F | Wt 251.7 lb

## 2019-09-05 DIAGNOSIS — E1165 Type 2 diabetes mellitus with hyperglycemia: Secondary | ICD-10-CM

## 2019-09-05 LAB — POCT GLYCOSYLATED HEMOGLOBIN (HGB A1C): Hemoglobin A1C: 7.1 % — AB (ref 4.0–5.6)

## 2019-09-05 MED ORDER — ROSUVASTATIN CALCIUM 10 MG PO TABS: 10.0000 mg | ORAL_TABLET | Freq: Every day | ORAL | 3 refills | Status: DC

## 2019-09-05 NOTE — Progress Notes (Signed)
  Subjective:     Patient ID: Peter Mcclure, male   DOB: 09/06/1970, 49 y.o.   MRN: [id number]  HPI Peter Mcclure is seen for medical follow-up.  Type 2 diabetes.  Steadily improving over the past year.  He was 9.4% a year ago.  He then decreased to 7.7 and then 7.3% in March and then 7.1 today.  He is exercising more.  Trying to be more diligent with diet.  He would like to try to manage this without further medications.  Remains on Metformin.  Hyperlipidemia treated with Lipitor.  He has had some myalgias arms and legs fairly consistently.  Has not been tried on other statins.  No significant arthralgias.  No muscle weakness  Recent palpitations.  These are symptomatically improved on low-dose beta-blocker Toprol-XL 25 mg daily.  He has event monitor in place.  No recent chest pains.  He has been scaling back alcohol use.  Past Medical History:  Diagnosis Date  . Diabetes mellitus without complication (HCC)   . Hypertension    No past surgical history on file.  reports that he has never smoked. He has never used smokeless tobacco. He reports current alcohol use of about 4.0 standard drinks of alcohol per week. He reports that he does not use drugs. family history includes Diabetes in an other family member; Hypertension in an other family member. No Known Allergies     Review of Systems  Constitutional: Negative for fatigue and unexpected weight change.  Eyes: Negative for visual disturbance.  Respiratory: Negative for cough, chest tightness and shortness of breath.   Cardiovascular: Negative for chest pain, palpitations and leg swelling.  Gastrointestinal: Negative for abdominal pain.  Neurological: Negative for dizziness, syncope, weakness, light-headedness and headaches.       Objective:   Physical Exam Constitutional:      Appearance: He is well-developed.  HENT:     Right Ear: External ear normal.     Left Ear: External ear normal.  Eyes:     Pupils: Pupils are equal, round,  and reactive to light.  Neck:     Thyroid: No thyromegaly.  Cardiovascular:     Rate and Rhythm: Normal rate and regular rhythm.  Pulmonary:     Effort: Pulmonary effort is normal. No respiratory distress.     Breath sounds: Normal breath sounds. No wheezing or rales.  Musculoskeletal:     Cervical back: Neck supple.  Neurological:     Mental Status: He is alert and oriented to person, place, and time.        Assessment:     #1 type 2 diabetes improving with A1c today 7.1%  #2 hypertension stable and at goal  #3 palpitations improved on Toprol-XL.  Event monitor pending  #4 myalgias possibly related to statin use with Lipitor    Plan:     -Continue positive lifestyle changes -Change Lipitor over to Crestor 10 mg once daily-and be in touch if myalgias not improving over the next several weeks -Recheck lipid and hepatic panel at follow-up along with A1c in 3 months -Follow-up on echocardiogram and event monitoring which are in process  [name] MD [facility]

## 2019-09-11 ENCOUNTER — Other Ambulatory Visit (HOSPITAL_COMMUNITY): Payer: Self-pay

## 2019-09-11 ENCOUNTER — Encounter (HOSPITAL_COMMUNITY): Payer: Self-pay | Admitting: Cardiology

## 2019-09-11 NOTE — Progress Notes (Unsigned)
Patient ID: Peter Mcclure, male   DOB: [birth year], 49 y.o.   MRN: [id number]   Verified appointment "no show" status with Helmut Muster at 12:51.

## 2019-09-12 ENCOUNTER — Encounter: Payer: Self-pay | Admitting: Cardiology

## 2019-09-13 ENCOUNTER — Other Ambulatory Visit: Payer: Self-pay | Admitting: Family Medicine

## 2019-09-16 ENCOUNTER — Telehealth: Payer: Self-pay

## 2019-09-16 ENCOUNTER — Encounter (HOSPITAL_COMMUNITY): Payer: Self-pay | Admitting: Family Medicine

## 2019-09-16 NOTE — Telephone Encounter (Signed)
Received critical monitor alert for 09/04/2019 at 829pm as pt triggered alert.  Monitor tech unable to reach pt to confirm pt passed out.  Attempted phone call to pt and call went to message stating voicemail has not been set up.  Monitor report taken to DOD for review and sign.

## 2019-09-19 ENCOUNTER — Telehealth: Payer: Self-pay | Admitting: *Deleted

## 2019-09-19 NOTE — Telephone Encounter (Signed)
Patient called nurse triage 09/18/2019 at 8:36 PM. Patient reports his blood pressure is 160/90. States he has a lingering headache that comes and goes for about 2 weeks now, mentioned it at his appointment about 1-2 weeks ago. States it is not present right now. Wondering what he should do.  Patient  advised to see PCP

## 2019-09-19 NOTE — Telephone Encounter (Signed)
Pt scheduled for Monday at 10:15 with Burchette. He only wanted to see his PCP

## 2019-09-19 NOTE — Telephone Encounter (Signed)
Tried calling pt unable to leave message due to mailbox not being set up to get pt appt

## 2019-09-22 ENCOUNTER — Ambulatory Visit: Payer: Self-pay | Admitting: Family Medicine

## 2019-09-23 ENCOUNTER — Ambulatory Visit: Payer: Self-pay | Admitting: Family Medicine

## 2019-09-26 ENCOUNTER — Telehealth (HOSPITAL_COMMUNITY): Payer: Self-pay | Admitting: Family Medicine

## 2019-09-26 ENCOUNTER — Ambulatory Visit: Payer: Self-pay | Admitting: Family Medicine

## 2019-09-26 NOTE — Telephone Encounter (Signed)
Just an FYI. We have made several attempts to contact this patient including sending a letter to schedule or reschedule their echocardiogram. We will be removing the patient from the echo WQ.  09/16/2019 MAILED LETTER  09/11/2019 pt no showed Called and NVM set up @ 2:20/LBW    Thank you

## 2019-09-26 NOTE — Telephone Encounter (Signed)
noted 

## 2019-11-04 ENCOUNTER — Ambulatory Visit: Payer: Self-pay | Admitting: Family Medicine

## 2019-11-22 ENCOUNTER — Other Ambulatory Visit: Payer: Self-pay | Admitting: Family Medicine

## 2019-12-10 ENCOUNTER — Other Ambulatory Visit: Payer: Self-pay | Admitting: Family Medicine

## 2019-12-10 ENCOUNTER — Ambulatory Visit: Payer: Self-pay | Admitting: Family Medicine

## 2019-12-12 ENCOUNTER — Other Ambulatory Visit: Payer: Self-pay

## 2019-12-12 ENCOUNTER — Encounter: Payer: Self-pay | Admitting: Family Medicine

## 2019-12-12 ENCOUNTER — Ambulatory Visit (INDEPENDENT_AMBULATORY_CARE_PROVIDER_SITE_OTHER): Payer: Self-pay | Admitting: Family Medicine

## 2019-12-12 VITALS — BP 114/70 | HR 94 | Temp 98.0°F | Ht 75.5 in | Wt 252.1 lb

## 2019-12-12 DIAGNOSIS — R140 Abdominal distension (gaseous): Secondary | ICD-10-CM

## 2019-12-12 DIAGNOSIS — G4700 Insomnia, unspecified: Secondary | ICD-10-CM

## 2019-12-12 DIAGNOSIS — E1165 Type 2 diabetes mellitus with hyperglycemia: Secondary | ICD-10-CM

## 2019-12-12 DIAGNOSIS — K219 Gastro-esophageal reflux disease without esophagitis: Secondary | ICD-10-CM

## 2019-12-12 DIAGNOSIS — R918 Other nonspecific abnormal finding of lung field: Secondary | ICD-10-CM

## 2019-12-12 DIAGNOSIS — Z23 Encounter for immunization: Secondary | ICD-10-CM

## 2019-12-12 LAB — POCT GLYCOSYLATED HEMOGLOBIN (HGB A1C): Hemoglobin A1C: 7.1 % — AB (ref 4.0–5.6)

## 2019-12-12 MED ORDER — TRAZODONE HCL 50 MG PO TABS: 25.0000 mg | ORAL_TABLET | Freq: Every evening | ORAL | 3 refills | Status: DC | PRN

## 2019-12-12 MED ORDER — PANTOPRAZOLE SODIUM 40 MG PO TBEC: 40.0000 mg | DELAYED_RELEASE_TABLET | Freq: Every day | ORAL | 3 refills | Status: DC

## 2019-12-12 NOTE — Progress Notes (Signed)
Established Patient Office Visit  Subjective:  Patient ID: Peter Mcclure, male    DOB: 1970/12/20  Age: 49 y.o. MRN: [id number]  CC:  Chief Complaint  Patient presents with   Diabetes    Patient thinks he has GI issues, gas, bloating, heartburn, fullness   Insomnia    not able to sleep well    HPI Peter Mcclure presents for medical follow-up.  He has type 2 diabetes, hypertension, hyperlipidemia, GERD  Type 2 diabetes.  Last A1c 7.1%.  We elected 3 months of lifestyle modification but he has not made any real changes.  Still not exercising regularly.  Not monitoring blood sugars regularly.  No polyuria or polydipsia.  Has not set up eye exam yet.  He has history of GERD.  Previously on Protonix.  He ran out and tried multiple over-the-counter things including Pepcid, over-the-counter Nexium without relief.  Frequent and almost daily symptoms.  Frequent bloating.  No stool changes.  He would like to get GI referral and consider colonoscopy  Insomnia.  He has difficulty occasionally falling asleep but mostly with early morning awakening.  No major depression issues.  Does occasionally drink alcohol at night but not consistently.  No consistent daytime naps.  Tried over-the-counter things including melatonin and Benadryl without relief.  He had CT angiogram of the chest in the ER last January.  This showed some bilateral pulmonary nodules with recommended 20-month follow-up.  He needs to get this done.  He is on a recent cough.  Past Medical History:  Diagnosis Date   Diabetes mellitus without complication (HCC)    Hypertension     History reviewed. No pertinent surgical history.  Family History  Problem Relation Age of Onset   Diabetes Other    Hypertension Other     Social History   Socioeconomic History   Marital status: Married    Spouse name: Not on file   Number of children: Not on file   Years of education: Not on file   Highest education level: Not on file    Occupational History   Not on file  Tobacco Use   Smoking status: Never Smoker   Smokeless tobacco: Never Used  Vaping Use   Vaping Use: Never used  Substance and Sexual Activity   Alcohol use: Yes    Alcohol/week: 4.0 standard drinks    Types: 4 Cans of beer per week    Comment: 4-6 beers a day   Drug use: No   Sexual activity: Never  Other Topics Concern   Not on file  Social History Narrative   Not on file   Social Determinants of Health   Financial Resource Strain:    Difficulty of Paying Living Expenses: Not on file  Food Insecurity:    Worried About Running Out of Food in the Last Year: Not on file   Ran Out of Food in the Last Year: Not on file  Transportation Needs:    Lack of Transportation (Medical): Not on file   Lack of Transportation (Non-Medical): Not on file  Physical Activity:    Days of Exercise per Week: Not on file   Minutes of Exercise per Session: Not on file  Stress:    Feeling of Stress : Not on file  Social Connections:    Frequency of Communication with Friends and Family: Not on file   Frequency of Social Gatherings with Friends and Family: Not on file   Attends Religious Services: Not on file  Active Member of Clubs or Organizations: Not on file   Attends Banker Meetings: Not on file   Marital Status: Not on file  Intimate Partner Violence:    Fear of Current or Ex-Partner: Not on file   Emotionally Abused: Not on file   Physically Abused: Not on file   Sexually Abused: Not on file    Outpatient Medications Prior to Visit  Medication Sig Dispense Refill   acetaminophen (TYLENOL) 500 MG tablet Take 500-1,000 mg by mouth every 6 (six) hours as needed for moderate pain.      cyclobenzaprine (FLEXERIL) 5 MG tablet Take 1 tablet (5 mg total) by mouth at bedtime. 7 tablet 0   ibuprofen (ADVIL) 200 MG tablet Take 400-800 mg by mouth every 6 (six) hours as needed (knee).     ibuprofen  (ADVIL,MOTRIN) 600 MG tablet Take 1 tablet (600 mg total) by mouth every 6 (six) hours as needed. 30 tablet 0   lisinopril (ZESTRIL) 10 MG tablet Take 1 tablet by mouth once daily 90 tablet 1   metFORMIN (GLUCOPHAGE) 500 MG tablet Take 2 tablets by mouth twice daily 360 tablet 0   metoprolol succinate (TOPROL-XL) 25 MG 24 hr tablet TAKE 1 TABLET BY MOUTH ONCE DAILY AS NEEDED FOR  INCREASED  PALPITATIONS 30 tablet 0   rosuvastatin (CRESTOR) 10 MG tablet Take 1 tablet (10 mg total) by mouth daily. 90 tablet 3   sildenafil (VIAGRA) 100 MG tablet Take one half to one tablet daily as needed for erectile dysfunction. 10 tablet 11   pantoprazole (PROTONIX) 40 MG tablet Take 1 tablet (40 mg total) by mouth daily. (Patient not taking: Reported on 12/12/2019) 30 tablet 3   No facility-administered medications prior to visit.    No Known Allergies  ROS Review of Systems  Constitutional: Negative for fatigue.  Eyes: Negative for visual disturbance.  Respiratory: Negative for cough, chest tightness and shortness of breath.   Cardiovascular: Negative for chest pain, palpitations and leg swelling.  Gastrointestinal: Negative for blood in stool, diarrhea, nausea and vomiting.  Genitourinary: Negative for dysuria.  Neurological: Negative for dizziness, syncope, weakness, light-headedness and headaches.      Objective:    Physical Exam Constitutional:      Appearance: He is well-developed.  HENT:     Right Ear: External ear normal.     Left Ear: External ear normal.  Eyes:     Pupils: Pupils are equal, round, and reactive to light.  Neck:     Thyroid: No thyromegaly.  Cardiovascular:     Rate and Rhythm: Normal rate and regular rhythm.  Pulmonary:     Effort: Pulmonary effort is normal. No respiratory distress.     Breath sounds: Normal breath sounds. No wheezing or rales.  Musculoskeletal:     Cervical back: Neck supple.     Right lower leg: No edema.     Left lower leg: No edema.   Neurological:     Mental Status: He is alert and oriented to person, place, and time.     BP 114/70    Pulse 94    Temp 98 F (36.7 C) (Oral)    Ht 6' 3.5" (1.918 m)    Wt 252 lb 1.6 oz (114.4 kg)    SpO2 98%    BMI 31.09 kg/m  Wt Readings from Last 3 Encounters:  12/12/19 252 lb 1.6 oz (114.4 kg)  09/05/19 251 lb 11.2 oz (114.2 kg)  07/29/19 245 lb (111.1  kg)     Health Maintenance Due  Topic Date Due   Hepatitis C Screening  Never done   COVID-19 Vaccine (1) Never done   HIV Screening  Never done   OPHTHALMOLOGY EXAM  02/17/2014   INFLUENZA VACCINE  10/12/2019   FOOT EXAM  10/28/2019    There are no preventive care reminders to display for this patient.  Lab Results  Component Value Date   TSH 1.841 07/19/2019   Lab Results  Component Value Date   WBC 6.2 07/19/2019   HGB 13.3 07/19/2019   HCT 39.5 07/19/2019   MCV 94.7 07/19/2019   PLT 261 07/19/2019   Lab Results  Component Value Date   NA 136 07/19/2019   K 4.1 07/19/2019   CO2 22 07/19/2019   GLUCOSE 225 (H) 07/19/2019   BUN 13 07/19/2019   CREATININE 0.70 07/19/2019   BILITOT 0.8 07/19/2019   ALKPHOS 81 07/19/2019   AST 28 07/19/2019   ALT 46 (H) 07/19/2019   PROT 7.2 07/19/2019   ALBUMIN 4.1 07/19/2019   CALCIUM 8.7 (L) 07/19/2019   ANIONGAP 10 07/19/2019   GFR 132.53 10/28/2018   Lab Results  Component Value Date   CHOL 152 10/28/2018   Lab Results  Component Value Date   HDL 41.50 10/28/2018   Lab Results  Component Value Date   LDLCALC 75 10/28/2018   Lab Results  Component Value Date   TRIG 179.0 (H) 10/28/2018   Lab Results  Component Value Date   CHOLHDL 4 10/28/2018   Lab Results  Component Value Date   HGBA1C 7.1 (A) 12/12/2019      Assessment & Plan:   #1 type 2 diabetes.  A1c stable at 7.1%.  -We discussed options and he would like to give this 3 more months of lifestyle management. -Recommend setting up eye exam -Establish more consistent  exercise  #2 GERD.  Patient currently not well controlled with over-the-counter medications  -Refill Protonix 40 mg once daily and be in touch if symptoms not improved with this.  #3 frequent abdominal bloating.  We discussed high gas producing foods.  Patient requesting GI referral and we will go ahead and set this up.  Recommend low FODMAP diet  #4 insomnia -Sleep hygiene discussed with handout given.  He has had no relief with over-the-counter things like melatonin or Benadryl. -Avoid regular use of alcohol at night -Discussed trial of low-dose trazodone 50 mg nightly as needed but recommend avoid regular use if possible  #5 history of bilateral pulmonary nodules noted on CT angiogram chest January 2021  -Set up repeat noncontrast CT chest to further evaluate  Meds ordered this encounter  Medications   pantoprazole (PROTONIX) 40 MG tablet    Sig: Take 1 tablet (40 mg total) by mouth daily.    Dispense:  90 tablet    Refill:  3   traZODone (DESYREL) 50 MG tablet    Sig: Take 0.5-1 tablets (25-50 mg total) by mouth at bedtime as needed for sleep.    Dispense:  30 tablet    Refill:  3    Follow-up: Return in about 3 months (around 03/13/2020).    Evelena Peat, MD

## 2019-12-12 NOTE — Patient Instructions (Signed)

## 2019-12-12 NOTE — Addendum Note (Signed)
Addended by: Ronita Hipps on: 12/12/2019 08:19 AM   Modules accepted: Orders

## 2020-01-05 ENCOUNTER — Other Ambulatory Visit: Payer: Self-pay

## 2020-01-05 ENCOUNTER — Encounter (HOSPITAL_COMMUNITY): Payer: Self-pay | Admitting: Emergency Medicine

## 2020-01-05 ENCOUNTER — Emergency Department (HOSPITAL_COMMUNITY): Payer: Self-pay

## 2020-01-05 ENCOUNTER — Emergency Department (HOSPITAL_COMMUNITY)
Admission: EM | Admit: 2020-01-05 | Discharge: 2020-01-05 | Disposition: A | Discharge status: Home / Self Care | Payer: Self-pay | Attending: Emergency Medicine | Admitting: Emergency Medicine

## 2020-01-05 DIAGNOSIS — Z79899 Other long term (current) drug therapy: Secondary | ICD-10-CM | POA: Insufficient documentation

## 2020-01-05 DIAGNOSIS — E119 Type 2 diabetes mellitus without complications: Secondary | ICD-10-CM | POA: Insufficient documentation

## 2020-01-05 DIAGNOSIS — I10 Essential (primary) hypertension: Secondary | ICD-10-CM | POA: Insufficient documentation

## 2020-01-05 DIAGNOSIS — Z7984 Long term (current) use of oral hypoglycemic drugs: Secondary | ICD-10-CM | POA: Insufficient documentation

## 2020-01-05 DIAGNOSIS — R079 Chest pain, unspecified: Secondary | ICD-10-CM | POA: Insufficient documentation

## 2020-01-05 LAB — CBC
HCT: 39.3 % (ref 39.0–52.0)
Hemoglobin: 13.4 g/dL (ref 13.0–17.0)
MCH: 32.1 pg (ref 26.0–34.0)
MCHC: 34.1 g/dL (ref 30.0–36.0)
MCV: 94 fL (ref 80.0–100.0)
Platelets: 273 10*3/uL (ref 150–400)
RBC: 4.18 MIL/uL — ABNORMAL LOW (ref 4.22–5.81)
RDW: 11.2 % — ABNORMAL LOW (ref 11.5–15.5)
WBC: 7.3 10*3/uL (ref 4.0–10.5)
nRBC: 0 % (ref 0.0–0.2)

## 2020-01-05 LAB — BASIC METABOLIC PANEL
Anion gap: 10 (ref 5–15)
BUN: 14 mg/dL (ref 6–20)
CO2: 22 mmol/L (ref 22–32)
Calcium: 9.1 mg/dL (ref 8.9–10.3)
Chloride: 103 mmol/L (ref 98–111)
Creatinine, Ser: 0.68 mg/dL (ref 0.61–1.24)
GFR, Estimated: >60 mL/min (ref >60)
Glucose, Bld: 183 mg/dL — ABNORMAL HIGH (ref 70–99)
Potassium: 3.9 mmol/L (ref 3.5–5.1)
Sodium: 135 mmol/L (ref 135–145)

## 2020-01-05 LAB — TROPONIN I (HIGH SENSITIVITY)
Troponin I (High Sensitivity): 3 ng/L (ref <18)
Troponin I (High Sensitivity): 3 ng/L (ref <18)

## 2020-01-05 NOTE — Discharge Instructions (Addendum)
You were seen in the emergency department for evaluation of chest pain.  You had blood work EKG chest x-ray that did not show any serious findings.  Your symptoms had resolved while you were here.  Please follow-up with your regular doctor.  Return to the emergency department if any worsening or concerning symptoms.

## 2020-01-05 NOTE — ED Provider Notes (Signed)
[facility]-EMERGENCY DEPT Provider Note   CSN: [id number] Arrival date & time: 01/05/20  1916     History Chief Complaint  Patient presents with  . Chest Pain    Peter Mcclure is a 49 y.o. male.  He is here with a complaint of left-sided chest discomfort.  He said he did not want to call the pain and it was just uncomfortable feeling.  It did not move.  Was not associated with shortness of breath nausea vomiting diarrhea diaphoresis dizziness lightheadedness.  He says is gone now and he thinks it might have been gas but he wanted to make sure it was nothing more serious.  No prior history of cardiac disease.  Does have GI issues and is awaiting follow-up with a gastroenterologist.  Denies tobacco or cocaine.  The history is provided by the patient.  Chest Pain Pain location:  L chest Pain quality: dull   Pain radiates to:  Does not radiate Pain severity:  Mild Onset quality:  Gradual Timing:  Constant Progression:  Resolved Chronicity:  New Relieved by:  None tried Worsened by:  Nothing Ineffective treatments:  None tried Associated symptoms: no abdominal pain, no back pain, no cough, no diaphoresis, no dizziness, no fever, no headache, no heartburn, no nausea, no shortness of breath and no vomiting   Risk factors: diabetes mellitus, high cholesterol and hypertension   Risk factors: no coronary artery disease     HPI: A 49 year old patient with a history of treated diabetes, hypertension, hypercholesterolemia and obesity presents for evaluation of chest pain. Initial onset of pain was approximately 3-6 hours ago. The patient's chest pain is well-localized, is described as heaviness/pressure/tightness and is not worse with exertion. The patient's chest pain is middle- or left-sided, is not sharp and does not radiate to the arms/jaw/neck. The patient does not complain of nausea and denies diaphoresis. The patient has no history of stroke, has no history of  peripheral artery disease, has not smoked in the past 90 days and has no relevant family history of coronary artery disease (first degree relative at less than age 52).   Past Medical History:  Diagnosis Date  . Diabetes mellitus without complication (HCC)   . Hypertension     Patient Active Problem List   Diagnosis Date Noted  . Hyperlipidemia 10/28/2018  . Obesity (BMI 30-39.9) 01/20/2013  . Diabetes mellitus type 2, uncontrolled (HCC) 01/20/2013  . HEMATOCHEZIA 03/30/2009  . 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 Hypertension 11/13/2008  . GERD 11/13/2008    History reviewed. No pertinent surgical history.     Family History  Problem Relation Age of Onset  . Diabetes Other   . Hypertension Other     Social History   Tobacco Use  . Smoking status: Never Smoker  . Smokeless tobacco: Never Used  Vaping Use  . Vaping Use: Never used  Substance Use Topics  . Alcohol use: Yes    Alcohol/week: 4.0 standard drinks    Types: 4 Cans of beer per week    Comment: 4-6 beers a day  . Drug use: No    Home Medications Prior to Admission medications   Medication Sig Start Date End Date Taking? Authorizing Provider  acetaminophen (TYLENOL) 500 MG tablet Take 500-1,000 mg by mouth every 6 (six) hours as needed for moderate pain.     [provider]  cyclobenzaprine (FLEXERIL) 5 MG tablet Take 1 tablet (5 mg total) by mouth at bedtime. 04/25/18   Lauenstein,  Kenyon Ana, MD  ibuprofen (ADVIL) 200 MG tablet Take 400-800 mg by mouth every 6 (six) hours as needed (knee).    [provider]  ibuprofen (ADVIL,MOTRIN) 600 MG tablet Take 1 tablet (600 mg total) by mouth every 6 (six) hours as needed. 11/13/16   Audry Pili, PA-C  lisinopril (ZESTRIL) 10 MG tablet Take 1 tablet by mouth once daily 09/16/19   Burchette, Elberta Fortis, MD  metFORMIN (GLUCOPHAGE) 500 MG tablet Take 2 tablets by mouth twice daily 11/22/19   Burchette, Elberta Fortis, MD  metoprolol succinate (TOPROL-XL) 25 MG  24 hr tablet TAKE 1 TABLET BY MOUTH ONCE DAILY AS NEEDED FOR  INCREASED  PALPITATIONS 12/10/19   Burchette, Elberta Fortis, MD  pantoprazole (PROTONIX) 40 MG tablet Take 1 tablet (40 mg total) by mouth daily. 12/12/19   Burchette, Elberta Fortis, MD  rosuvastatin (CRESTOR) 10 MG tablet Take 1 tablet (10 mg total) by mouth daily. 09/05/19   Burchette, Elberta Fortis, MD  sildenafil (VIAGRA) 100 MG tablet Take one half to one tablet daily as needed for erectile dysfunction. 02/19/19   Burchette, Elberta Fortis, MD  traZODone (DESYREL) 50 MG tablet Take 0.5-1 tablets (25-50 mg total) by mouth at bedtime as needed for sleep. 12/12/19   Burchette, Elberta Fortis, MD    Allergies    Patient has no known allergies.  Review of Systems   Review of Systems  Constitutional: Negative for diaphoresis and fever.  HENT: Negative for sore throat.   Eyes: Negative for visual disturbance.  Respiratory: Negative for cough and shortness of breath.   Cardiovascular: Positive for chest pain.  Gastrointestinal: Negative for abdominal pain, heartburn, nausea and vomiting.  Genitourinary: Negative for dysuria.  Musculoskeletal: Negative for back pain.  Skin: Negative for rash.  Neurological: Negative for dizziness and headaches.    Physical Exam Updated Vital Signs BP (!) 141/88 (BP Location: Left Arm)   Pulse 96   Temp 98.6 F (37 C) (Oral)   Resp 17   Ht 6' 4.5" (1.943 m)   Wt 113.4 kg   SpO2 99%   BMI 30.03 kg/m   Physical Exam Vitals and nursing note reviewed.  Constitutional:      Appearance: Normal appearance. He is well-developed.  HENT:     Head: Normocephalic and atraumatic.  Eyes:     Conjunctiva/sclera: Conjunctivae normal.  Cardiovascular:     Rate and Rhythm: Normal rate and regular rhythm.     Heart sounds: No murmur heard.   Pulmonary:     Effort: Pulmonary effort is normal. No respiratory distress.     Breath sounds: Normal breath sounds.  Abdominal:     Palpations: Abdomen is soft.     Tenderness: There is no  abdominal tenderness.  Musculoskeletal:        General: No deformity or signs of injury. Normal range of motion.     Cervical back: Neck supple.  Skin:    General: Skin is warm and dry.     Capillary Refill: Capillary refill takes less than 2 seconds.  Neurological:     General: No focal deficit present.     Mental Status: He is alert.     ED Results / Procedures / Treatments   Labs (all labs ordered are listed, but only abnormal results are displayed) Labs Reviewed  BASIC METABOLIC PANEL - Abnormal; Notable for the following components:      Result Value   Glucose, Bld 183 (*)    All other components within normal  limits  CBC - Abnormal; Notable for the following components:   RBC 4.18 (*)    RDW 11.2 (*)    All other components within normal limits  TROPONIN I (HIGH SENSITIVITY)  TROPONIN I (HIGH SENSITIVITY)    EKG EKG Interpretation  Date/Time:  Monday January 05 2020 19:28:53 EDT Ventricular Rate:  96 PR Interval:    QRS Duration: 96 QT Interval:  346 QTC Calculation: 438 R Axis:   49 Text Interpretation: Sinus rhythm Probable left atrial enlargement RSR' in V1 or V2, right VCD or RVH Baseline wander in lead(s) V6 No significant change since prior 5/21 Confirmed by Meridee Score [phone]) on 01/05/2020 9:04:17 PM   Radiology DG Chest 2 View  Result Date: 01/05/2020 CLINICAL DATA:  Chest pain EXAM: CHEST - 2 VIEW COMPARISON:  06/18/2018 FINDINGS: The heart size and mediastinal contours are within normal limits. Both lungs are clear. The visualized skeletal structures are unremarkable. IMPRESSION: No active cardiopulmonary disease. Electronically Signed   By: Deatra Robinson M.D.   On: 01/05/2020 20:04    Procedures Procedures (including critical care time)  Medications Ordered in ED Medications - No data to display  ED Course  I have reviewed the triage vital signs and the nursing notes.  Pertinent labs & imaging results that were available during my care  of the patient were reviewed by me and considered in my medical decision making (see chart for details).    MDM Rules/Calculators/A&P HEAR Score: 3                       This patient complains of chest discomfort; this involves an extensive number of treatment Options and is a complaint that carries with it a high risk of complications and Morbidity. The differential includes ACS, pneumonia, pneumothorax, vascular, GERD, musculoskeletal  I ordered, reviewed and interpreted labs, which included CBC with normal white count normal hemoglobin, chemistries normal other than elevated glucose, troponins flat I ordered imaging studies which included chest x-ray and I independently    visualized and interpreted imaging which showed no acute pulmonary disease Previous records obtained and reviewed in epic, no recent visits  After the interventions stated above, I reevaluated the patient and found patient to be asymptomatic with no discomfort in his chest.  His work-up has been unremarkable.  He is comfortable plan for following up with his PCP.  Return instructions discussed.   Final Clinical Impression(s) / ED Diagnoses Final diagnoses:  Nonspecific chest pain    Rx / DC Orders ED Discharge Orders    None       Terrilee Files, MD 01/06/20 1031

## 2020-01-05 NOTE — ED Triage Notes (Signed)
Patient complaining of mid chest pain. Patient states this started around 5pm. Patient states it is a discomfort that he can not describe.

## 2020-01-12 ENCOUNTER — Other Ambulatory Visit: Payer: Self-pay | Admitting: Family Medicine

## 2020-01-12 ENCOUNTER — Ambulatory Visit: Payer: Self-pay | Admitting: Family Medicine

## 2020-01-14 ENCOUNTER — Ambulatory Visit (INDEPENDENT_AMBULATORY_CARE_PROVIDER_SITE_OTHER): Payer: Self-pay | Admitting: Family Medicine

## 2020-01-14 ENCOUNTER — Other Ambulatory Visit: Payer: Self-pay

## 2020-01-14 ENCOUNTER — Encounter: Payer: Self-pay | Admitting: Family Medicine

## 2020-01-14 VITALS — BP 140/80 | HR 88 | Temp 98.6°F | Ht 76.5 in | Wt 252.4 lb

## 2020-01-14 DIAGNOSIS — I10 Essential (primary) hypertension: Secondary | ICD-10-CM

## 2020-01-14 DIAGNOSIS — N529 Male erectile dysfunction, unspecified: Secondary | ICD-10-CM

## 2020-01-14 DIAGNOSIS — R519 Headache, unspecified: Secondary | ICD-10-CM

## 2020-01-14 MED ORDER — TRAZODONE HCL 50 MG PO TABS
25.0000 mg | ORAL_TABLET | Freq: Every evening | ORAL | 3 refills | Status: DC | PRN
Start: 2020-01-14 — End: 2021-06-08

## 2020-01-14 MED ORDER — SILDENAFIL CITRATE 100 MG PO TABS
ORAL_TABLET | ORAL | 11 refills | Status: DC
Start: 2020-01-14 — End: 2021-05-06

## 2020-01-14 NOTE — Progress Notes (Signed)
Established Patient Office Visit  Subjective:  Patient ID: Peter Mcclure, male    DOB: September 04, 1970  Age: 49 y.o. MRN: [id number]  CC:  Chief Complaint  Patient presents with  . Headache    HEADACHES going for x 1  off and goes,  dull pain , taking otc medication , not helping     HPI Peter Mcclure presents for discussion of several items.  His main issue is he has had some recent very mild bifrontal headaches.  These are nonexertional.  He was concerned because his blood pressure at home has been slightly elevated.  He had several readings in the 140s.  He is currently on lisinopril 10 mg daily and metoprolol XL 25 mg daily.  Denies any sinusitis symptoms.  No recent head injury.  No progressive headache.  He states severity is relatively mild.  No fevers or chills.  Denies any focal weakness or numbness.  No visual changes.  He has erectile dysfunction.  He has used Viagra in the past and requesting refills.  No nitroglycerin use.  Past Medical History:  Diagnosis Date  . Diabetes mellitus without complication (HCC)   . Hypertension     History reviewed. No pertinent surgical history.  Family History  Problem Relation Age of Onset  . Diabetes Other   . Hypertension Other     Social History   Socioeconomic History  . Marital status: Married    Spouse name: Not on file  . Number of children: Not on file  . Years of education: Not on file  . Highest education level: Not on file  Occupational History  . Not on file  Tobacco Use  . Smoking status: Never Smoker  . Smokeless tobacco: Never Used  Vaping Use  . Vaping Use: Never used  Substance and Sexual Activity  . Alcohol use: Yes    Alcohol/week: 4.0 standard drinks    Types: 4 Cans of beer per week    Comment: 4-6 beers a day  . Drug use: No  . Sexual activity: Never  Other Topics Concern  . Not on file  Social History Narrative  . Not on file   Social Determinants of Health   Financial Resource Strain:   .  Difficulty of Paying Living Expenses: Not on file  Food Insecurity:   . Worried About Programme researcher, broadcasting/film/video in the Last Year: Not on file  . Ran Out of Food in the Last Year: Not on file  Transportation Needs:   . Lack of Transportation (Medical): Not on file  . Lack of Transportation (Non-Medical): Not on file  Physical Activity:   . Days of Exercise per Week: Not on file  . Minutes of Exercise per Session: Not on file  Stress:   . Feeling of Stress : Not on file  Social Connections:   . Frequency of Communication with Friends and Family: Not on file  . Frequency of Social Gatherings with Friends and Family: Not on file  . Attends Religious Services: Not on file  . Active Member of Clubs or Organizations: Not on file  . Attends Banker Meetings: Not on file  . Marital Status: Not on file  Intimate Partner Violence:   . Fear of Current or Ex-Partner: Not on file  . Emotionally Abused: Not on file  . Physically Abused: Not on file  . Sexually Abused: Not on file    Outpatient Medications Prior to Visit  Medication Sig Dispense Refill  .  acetaminophen (TYLENOL) 500 MG tablet Take 500-1,000 mg by mouth every 6 (six) hours as needed for moderate pain.     Marland Kitchen ibuprofen (ADVIL) 200 MG tablet Take 400-800 mg by mouth every 6 (six) hours as needed (knee).    Marland Kitchen lisinopril (ZESTRIL) 20 MG tablet Take 20 mg by mouth daily.    . metFORMIN (GLUCOPHAGE) 500 MG tablet Take 2 tablets by mouth twice daily (Patient taking differently: Take 2,000 mg by mouth 2 (two) times daily with a meal. ) 360 tablet 0  . metoprolol succinate (TOPROL-XL) 25 MG 24 hr tablet TAKE 1 TABLET BY MOUTH ONCE DAILY AS NEEDED FOR  INCREASED  PALPITATIONS 30 tablet 0  . pantoprazole (PROTONIX) 40 MG tablet Take 1 tablet (40 mg total) by mouth daily. 90 tablet 3  . rosuvastatin (CRESTOR) 10 MG tablet Take 1 tablet (10 mg total) by mouth daily. 90 tablet 3  . lisinopril (ZESTRIL) 10 MG tablet Take 1 tablet by mouth  once daily 90 tablet 1  . sildenafil (VIAGRA) 100 MG tablet Take one half to one tablet daily as needed for erectile dysfunction. 10 tablet 11  . traZODone (DESYREL) 50 MG tablet Take 0.5-1 tablets (25-50 mg total) by mouth at bedtime as needed for sleep. 30 tablet 3  . cyclobenzaprine (FLEXERIL) 5 MG tablet Take 1 tablet (5 mg total) by mouth at bedtime. (Patient not taking: Reported on 01/05/2020) 7 tablet 0  . ibuprofen (ADVIL,MOTRIN) 600 MG tablet Take 1 tablet (600 mg total) by mouth every 6 (six) hours as needed. (Patient not taking: Reported on 01/05/2020) 30 tablet 0   No facility-administered medications prior to visit.    No Known Allergies  ROS Review of Systems  Constitutional: Negative for chills, fatigue and fever.  HENT: Negative for sinus pressure and sinus pain.   Eyes: Negative for visual disturbance.  Respiratory: Negative for cough, chest tightness and shortness of breath.   Cardiovascular: Negative for chest pain, palpitations and leg swelling.  Endocrine: Negative for polydipsia and polyuria.  Neurological: Positive for headaches. Negative for dizziness, syncope, weakness and light-headedness.      Objective:    Physical Exam Constitutional:      Appearance: He is well-developed.  HENT:     Right Ear: External ear normal.     Left Ear: External ear normal.  Eyes:     Pupils: Pupils are equal, round, and reactive to light.  Neck:     Thyroid: No thyromegaly.  Cardiovascular:     Rate and Rhythm: Normal rate and regular rhythm.  Pulmonary:     Effort: Pulmonary effort is normal. No respiratory distress.     Breath sounds: Normal breath sounds. No wheezing or rales.  Musculoskeletal:     Cervical back: Neck supple.  Neurological:     Mental Status: He is alert and oriented to person, place, and time.     Cranial Nerves: No cranial nerve deficit or facial asymmetry.     Motor: No weakness.     BP 126/80 (BP Location: Left Arm, Patient Position:  Sitting, Cuff Size: Normal)   Pulse 88   Temp 98.6 F (37 C) (Oral)   Ht 6' 4.5" (1.943 m)   Wt 252 lb 6.4 oz (114.5 kg)   SpO2 97%   BMI 30.32 kg/m  Wt Readings from Last 3 Encounters:  01/14/20 252 lb 6.4 oz (114.5 kg)  01/05/20 250 lb (113.4 kg)  12/12/19 252 lb 1.6 oz (114.4 kg)  Health Maintenance Due  Topic Date Due  . Hepatitis C Screening  Never done  . COVID-19 Vaccine (1) Never done  . HIV Screening  Never done  . OPHTHALMOLOGY EXAM  02/17/2014  . FOOT EXAM  10/28/2019    There are no preventive care reminders to display for this patient.  Lab Results  Component Value Date   TSH 1.841 07/19/2019   Lab Results  Component Value Date   WBC 7.3 01/05/2020   HGB 13.4 01/05/2020   HCT 39.3 01/05/2020   MCV 94.0 01/05/2020   PLT 273 01/05/2020   Lab Results  Component Value Date   NA 135 01/05/2020   K 3.9 01/05/2020   CO2 22 01/05/2020   GLUCOSE 183 (H) 01/05/2020   BUN 14 01/05/2020   CREATININE 0.68 01/05/2020   BILITOT 0.8 07/19/2019   ALKPHOS 81 07/19/2019   AST 28 07/19/2019   ALT 46 (H) 07/19/2019   PROT 7.2 07/19/2019   ALBUMIN 4.1 07/19/2019   CALCIUM 9.1 01/05/2020   ANIONGAP 10 01/05/2020   GFR 132.53 10/28/2018   Lab Results  Component Value Date   CHOL 152 10/28/2018   Lab Results  Component Value Date   HDL 41.50 10/28/2018   Lab Results  Component Value Date   LDLCALC 75 10/28/2018   Lab Results  Component Value Date   TRIG 179.0 (H) 10/28/2018   Lab Results  Component Value Date   CHOLHDL 4 10/28/2018   Lab Results  Component Value Date   HGBA1C 7.1 (A) 12/12/2019      Assessment & Plan:   #1 bilateral headaches.  These are dull and relatively mild.  Possible tension type headache.  No sinusitis symptoms.  We discussed the fact that this could possibly be related to his blood pressure as well  -Increase blood pressure medication as below -Discussed red flag issues to look out for for headaches.  We will see  if improved blood pressure control leads to reduction in headaches  #2 history of transient insomnia.  We prescribed trazodone last visit but this never went through electronically to the pharmacy.  This is recent today  #3 hypertension suboptimally controlled with several home readings as well as my reading today. -Increase lisinopril to 20 mg daily -He has office follow-up in January.  He will monitor blood pressures and if not consistently below 140/90 over the next couple weeks be in touch  #4 erectile dysfunction -Refill Viagra 100 mg 1/2 to 1 tablet as needed  Meds ordered this encounter  Medications  . traZODone (DESYREL) 50 MG tablet    Sig: Take 0.5-1 tablets (25-50 mg total) by mouth at bedtime as needed for sleep.    Dispense:  30 tablet    Refill:  3  . sildenafil (VIAGRA) 100 MG tablet    Sig: Take one half to one tablet daily as needed for erectile dysfunction.    Dispense:  10 tablet    Refill:  11    Follow-up: No follow-ups on file.    Evelena Peat, MD

## 2020-01-14 NOTE — Patient Instructions (Signed)
Increase the Lisinopril to 20 mg daily  Monitor blood pressure and be in touch in 2-3 weeks if not consistently <140/90.

## 2020-01-21 ENCOUNTER — Ambulatory Visit: Payer: Self-pay | Admitting: Gastroenterology

## 2020-01-21 ENCOUNTER — Encounter: Payer: Self-pay | Admitting: Gastroenterology

## 2020-01-21 VITALS — BP 148/88 | HR 97 | Ht 76.5 in | Wt 249.0 lb

## 2020-01-21 DIAGNOSIS — R140 Abdominal distension (gaseous): Secondary | ICD-10-CM

## 2020-01-21 DIAGNOSIS — Z1211 Encounter for screening for malignant neoplasm of colon: Secondary | ICD-10-CM

## 2020-01-21 NOTE — Progress Notes (Signed)
[facility] Consult Note:  HistoryCrimson Mcclure 01/21/2020  Referring provider: [name], MD  Reason for consult/chief complaint: Abdominal Pain (dull pain/discomfort feeling /bloating ) and Gastroesophageal Reflux (Heart burn that comes and goes )   Subjective  HPI:  This is a very pleasant 49 year old man referred by primary care for digestive symptoms.  He has had several months of an intermittent dull generalized abdominal discomfort that he describes best as bloating.  He does not have distention, nausea, vomiting, irregular bowel habits or rectal bleeding.  He denies dysphagia or odynophagia.  He had been having some intermittent nonlocalized abdominal burning discomfort and was given daily Protonix for that by primary care.  He does not feel that it helped the symptoms.  Occasional heartburn and/or belching with certain food triggers.  Was in ED 01/05/2020 for dull nonradiating chest discomfort that had resolved by the time he was seen by physician there.  Negative cardiac work-up.  ROS:  Review of Systems  Constitutional: Negative for appetite change and unexpected weight change.  HENT: Negative for mouth sores and voice change.   Eyes: Negative for pain and redness.  Respiratory: Negative for cough and shortness of breath.   Cardiovascular: Negative for chest pain and palpitations.  Genitourinary: Negative for dysuria and hematuria.  Musculoskeletal: Negative for arthralgias and myalgias.  Skin: Negative for pallor and rash.  Neurological: Negative for weakness and headaches.  Hematological: Negative for adenopathy.     Past Medical History: Past Medical History:  Diagnosis Date  . Diabetes mellitus without complication (HCC)   . ED (erectile dysfunction)   . GERD (gastroesophageal reflux disease)   . Hepatic steatosis   . Hypertension   . Insomnia   . Pulmonary nodules      Past Surgical History: History reviewed. No  pertinent surgical history.   Family History: Family History  Problem Relation Age of Onset  . Diabetes Other   . Hypertension Other   . Colon cancer Neg Hx   . Stomach cancer Neg Hx   . Esophageal cancer Neg Hx   . Pancreatic cancer Neg Hx     Social History: Social History   Socioeconomic History  . Marital status: Married    Spouse name: Not on file  . Number of children: Not on file  . Years of education: Not on file  . Highest education level: Not on file  Occupational History  . Not on file  Tobacco Use  . Smoking status: Never Smoker  . Smokeless tobacco: Never Used  Vaping Use  . Vaping Use: Never used  Substance and Sexual Activity  . Alcohol use: Not Currently    Alcohol/week: 4.0 standard drinks    Types: 4 Cans of Mcclure per week    Comment: 4-6 beers a day  . Drug use: No  . Sexual activity: Never  Other Topics Concern  . Not on file  Social History Narrative  . Not on file   Social Determinants of Health   Financial Resource Strain:   . Difficulty of Paying Living Expenses: Not on file  Food Insecurity:   . Worried About Programme researcher, broadcasting/film/video in the Last Year: Not on file  . Ran Out of Food in the Last Year: Not on file  Transportation Needs:   . Lack of Transportation (Medical): Not on file  . Lack of Transportation (Non-Medical): Not on file  Physical Activity:   . Days of Exercise per Week: Not on file  .  Minutes of Exercise per Session: Not on file  Stress:   . Feeling of Stress : Not on file  Social Connections:   . Frequency of Communication with Friends and Family: Not on file  . Frequency of Social Gatherings with Friends and Family: Not on file  . Attends Religious Services: Not on file  . Active Member of Clubs or Organizations: Not on file  . Attends Banker Meetings: Not on file  . Marital Status: Not on file    Allergies: No Known Allergies  Outpatient Meds: Current Outpatient Medications  Medication Sig  Dispense Refill  . acetaminophen (TYLENOL) 500 MG tablet Take 500-1,000 mg by mouth every 6 (six) hours as needed for moderate pain.     Marland Kitchen ibuprofen (ADVIL) 200 MG tablet Take 400-800 mg by mouth every 6 (six) hours as needed (knee).    Marland Kitchen lisinopril (ZESTRIL) 20 MG tablet Take 20 mg by mouth daily.    . metFORMIN (GLUCOPHAGE) 500 MG tablet Take 2 tablets by mouth twice daily (Patient taking differently: Take 2,000 mg by mouth 2 (two) times daily with a meal. ) 360 tablet 0  . metoprolol succinate (TOPROL-XL) 25 MG 24 hr tablet TAKE 1 TABLET BY MOUTH ONCE DAILY AS NEEDED FOR  INCREASED  PALPITATIONS 30 tablet 0  . pantoprazole (PROTONIX) 40 MG tablet Take 1 tablet (40 mg total) by mouth daily. 90 tablet 3  . rosuvastatin (CRESTOR) 10 MG tablet Take 1 tablet (10 mg total) by mouth daily. 90 tablet 3  . sildenafil (VIAGRA) 100 MG tablet Take one half to one tablet daily as needed for erectile dysfunction. 10 tablet 11  . traZODone (DESYREL) 50 MG tablet Take 0.5-1 tablets (25-50 mg total) by mouth at bedtime as needed for sleep. 30 tablet 3   No current facility-administered medications for this visit.      ___________________________________________________________________ Objective   Exam:  BP (!) 148/88   Pulse 97   Ht 6' 4.5" (1.943 m)   Wt 249 lb (112.9 kg)   BMI 29.91 kg/m    General: Well-appearing  Eyes: sclera anicteric, no redness  ENT: oral mucosa moist without lesions, no cervical or supraclavicular lymphadenopathy  CV: RRR without murmur, S1/S2, no JVD, no peripheral edema  Resp: clear to auscultation bilaterally, normal RR and effort noted  GI: soft, no tenderness, with active bowel sounds. No guarding or palpable organomegaly noted.  Skin; warm and dry, no rash or jaundice noted  Neuro: awake, alert and oriented x 3. Normal gross motor function and fluent speech  Labs:  CBC Latest Ref Rng & Units 01/05/2020 07/19/2019 03/30/2019  WBC 4.0 - 10.5 K/uL 7.3 6.2  8.5  Hemoglobin 13.0 - 17.0 g/dL 54.2 70.6 23.7  Hematocrit 39 - 52 % 39.3 39.5 44.2  Platelets 150 - 400 K/uL 273 261 292   CMP Latest Ref Rng & Units 01/05/2020 07/19/2019 03/30/2019  Glucose 70 - 99 mg/dL 628(B) 151(V) 616(W)  BUN 6 - 20 mg/dL 14 13 12   Creatinine 0.61 - 1.24 mg/dL 7.37 1.06  Sodium 135 - 145 mmol/L 135 136 135  Potassium 3.5 - 5.1 mmol/L 3.9 4.1 3.5  Chloride 98 - 111 mmol/L 103 104 101  CO2 22 - 32 mmol/L 22 22 19(L)  Calcium 8.9 - 10.3 mg/dL 9.1 2.69) 9.6  Total Protein 6.5 - 8.1 g/dL - 7.2 4.8(N)  Total Bilirubin 0.3 - 1.2 mg/dL - 0.8 4.6(E)  Alkaline Phos 38 - 126 U/L - 81  78  AST 15 - 41 U/L - 28 33  ALT 0 - 44 U/L - 46(H) 55(H)   Negative troponin x 2 on 01/05/20 (ED visit)  Hemoglobin A1c typically runs low to mid sevens.  It was up to 9.4 in September 2019.  Radiologic Studies: EKG 01/06/2020 (ED visit) without ischemic changes.  CLINICAL DATA:  Chest pain   EXAM: CHEST - 2 VIEW   COMPARISON:  06/18/2018   FINDINGS: The heart size and mediastinal contours are within normal limits. Both lungs are clear. The visualized skeletal structures are unremarkable.   IMPRESSION: No active cardiopulmonary disease.     Electronically Signed   By: Deatra Robinson M.D.   On: 01/05/2020 20:04     Assessment: Encounter Diagnoses  Name Primary?  . Abdominal bloating Yes  . Special screening for malignant neoplasms, colon     Several months intermittent nonspecific bloating.  Occasional vague abdominal burning discomfort, no clear triggers or relieving factors.  No red flag symptoms.  Non-smoker,, few drinks couple times a week. Recent nonexertional chest pain, sounds noncardiac.  Does not sound likely reflux or upper GI nature.  Plan:  I do not think he needs daily PPI.  I recommended he stop it and just take it as needed for heartburn. Some written dietary advice given regarding bloating and gas.  Healthy diet and lifestyle and good control  of diabetes recommended.  Screening colonoscopy recommended.  He was agreeable, but says he needs to wait until he gets some medical insurance.  I encouraged him to call us as soon as he can do so and certainly sooner if he is having worsening or new digestive symptoms.  Thank you for the courtesy of this consult.  Please call me with any questions or concerns.  Charlie Pitter III  CC: Referring provider noted above

## 2020-01-21 NOTE — Patient Instructions (Addendum)
If you are age 49 or older, your body mass index should be between 23-30. Your Body mass index is 29.91 kg/m. If this is out of the aforementioned range listed, please consider follow up with your Primary Care Provider.  If you are age 35 or younger, your body mass index should be between 19-25. Your Body mass index is 29.91 kg/m. If this is out of the aformentioned range listed, please consider follow up with your Primary Care Provider.   It has been recommended to you by your physician that you have a(n) Colonoscopy completed. Per your request, we did not schedule the procedure(s) today. Please contact our office at [phone] should you decide to have the procedure completed. You will be scheduled for a pre-visit and procedure at that time.   Food Guidelines for those with chronic digestive trouble:  Many people have difficulty digesting certain foods, causing a variety of distressing and embarrassing symptoms such as abdominal pain, bloating and gas.  These foods may need to be avoided or consumed in small amounts.  Here are some tips that might be helpful for you.  1.   Lactose intolerance is the difficulty or complete inability to digest lactose, the natural sugar in milk and anything made from milk.  This condition is harmless, common, and can begin any time during life.  Some people can digest a modest amount of lactose while others cannot tolerate any.  Also, not all dairy products contain equal amounts of lactose.  For example, hard cheeses such as parmesan have less lactose than soft cheeses such as cheddar.  Yogurt has less lactose than milk or cheese.  Many packaged foods (even many brands of bread) have milk, so read ingredient lists carefully.  It is difficult to test for lactose intolerance, so just try avoiding lactose as much as possible for a week and see what happens with your symptoms.  If you seem to be lactose intolerant, the best plan is to avoid it (but make sure you get  calcium from another source).  The next best thing is to use lactase enzyme supplements, available over the counter everywhere.  Just know that many lactose intolerant people need to take several tablets with each serving of dairy to avoid symptoms.  Lastly, a lot of restaurant food is made with milk or butter.  Many are things you might not suspect, such as mashed potatoes, rice and pasta (cooked with butter) and "grilled" items.  If you are lactose intolerant, it never hurts to ask your server what has milk or butter.  2.   Fiber is an important part of your diet, but not all fiber is well-tolerated.  Insoluble fiber such as bran is often consumed by normal gut bacteria and converted into gas.  Soluble fiber such as oats, squash, carrots and green beans are typically tolerated better.  3.   Some types of carbohydrates can be poorly digested.  Examples include: fructose (apples, cherries, pears, raisins and other dried fruits), fructans (onions, zucchini, large amounts of wheat), sorbitol/mannitol/xylitol and sucralose/Splenda (common artificial sweeteners), and raffinose (lentils, broccoli, cabbage, asparagus, brussel sprouts, many types of beans).  Do a Programmer, multimedia for [employer] and you will find helpful information. Beano, a dietary supplement, will often help with raffinose-containing foods.  As with lactase tablets, you may need several per serving.  4.   Whenever possible, avoid processed food&meats and chemical additives.  High fructose corn syrup, a common sweetener, may be difficult to digest.  Eggs  and soy (comes from the soybean, and added to many foods now) are other common bloating/gassy foods.  5.  Regarding gluten:  gluten is a protein mainly found in wheat, but also rye and barley.  There is a condition called celiac sprue, which is an inflammatory reaction in the small intestine causing a variety of digestive symptoms.  Blood testing is highly reliable to look for this condition, and  sometimes upper endoscopy with small bowel biopsies may be necessary to make the diagnosis.  Many patients who test negative for celiac sprue report improvement in their digestive symptoms when they switch to a gluten-free diet.  However, in these "non-celiac gluten sensitive" patients, the true role of gluten in their symptoms is unclear.  Reducing carbohydrates in general may decrease the gas and bloating caused when gut bacteria consume carbs. Also, some of these patients may actually be intolerant of the baker's yeast in bread products rather than the gluten.  Flatbread and other reduced yeast breads might therefore be tolerated.  There is no specific testing available for most food intolerances, which are discovered mainly by dietary elimination.  Please do not embark on a gluten free diet unless directed by your doctor, as it is highly restrictive, and may lead to nutritional deficiencies if not carefully monitored.  Lastly, beware of internet claims offering "personalized" tests for food intolerances.  Such testing has no reliable scientific evidence to support its reliability and correlation to symptoms.    6.  The best advice is old advice, especially for those with chronic digestive trouble - try to eat "clean".  Balanced diet, avoid processed food, plenty of fruits and vegetables, cut down the sugar, minimal alcohol, avoid tobacco. Make time to care for yourself, get enough sleep, exercise when you can, reduce stress.  Your guts will thank you for it.   - Dr. [facility]

## 2020-01-31 ENCOUNTER — Other Ambulatory Visit: Payer: Self-pay | Admitting: Family Medicine

## 2020-02-21 ENCOUNTER — Other Ambulatory Visit: Payer: Self-pay | Admitting: Family Medicine

## 2020-03-08 ENCOUNTER — Ambulatory Visit: Payer: Self-pay | Admitting: *Deleted

## 2020-03-08 NOTE — Telephone Encounter (Signed)
C/o SOB when walking to bathroom. Patient reports he has had covid symptoms and positive for covid x 2 weeks. No longer has chills , headaches, fever. Continues to reports body aches and SOB and dry cough. Reports he is SOB with activity and requires extra time to sit and rest before he is not SOB. Called EMS this am at 5:30 and was treated with breathing treatment and steroids and felt better throughout day. Patient is now feeling more SOB. Denies chest pain , difficulty breathing, fever, rapid heart rate, . Encouraged patient to contact PCP in am for appt. Care advise given. Patient verbalized understanding of care advise and to call back or go to [facility] or ED if symptoms worsen.   Reason for Disposition  MILD difficulty breathing (e.g., minimal/no SOB at rest, SOB with walking, pulse <100)  Answer Assessment - Initial Assessment Questions 1. COVID-19 DIAGNOSIS: "Who made your COVID-19 diagnosis?" "Was it confirmed by a positive lab test?" If not diagnosed by a HCP, ask "Are there lots of cases (community spread) where you live?" Note: See public health department website, if unsure.     Covid clinic on [address]  2. COVID-19 EXPOSURE: "Was there any known exposure to COVID before the symptoms began?" CDC Definition of close contact: within 6 feet (2 meters) for a total of 15 minutes or more over a 24-hour period.      unknown 3. ONSET: "When did the COVID-19 symptoms start?"      2 weeks ago  4. WORST SYMPTOM: "What is your worst symptom?" (e.g., cough, fever, shortness of breath, muscle aches)     SOB 5. COUGH: "Do you have a cough?" If Yes, ask: "How bad is the cough?"       Cough dry aggressive  6. FEVER: "Do you have a fever?" If Yes, ask: "What is your temperature, how was it measured, and when did it start?"     No  7. RESPIRATORY STATUS: "Describe your breathing?" (e.g., shortness of breath, wheezing, unable to speak)      SOB 8. BETTER-SAME-WORSE: "Are you getting better, staying the same  or getting worse compared to yesterday?"  If getting worse, ask, "In what way?"     Worse  9. HIGH RISK DISEASE: "Do you have any chronic medical problems?" (e.g., asthma, heart or lung disease, weak immune system, obesity, etc.)     Diabetes , HTN 10. VACCINE: "Have you gotten the COVID-19 vaccine?" If Yes ask: "Which one, how many shots, when did you get it?"       No  11. PREGNANCY: "Is there any chance you are pregnant?" "When was your last menstrual period?"       no 12. OTHER SYMPTOMS: "Do you have any other symptoms?"  (e.g., chills, fatigue, headache, loss of smell or taste, muscle pain, sore throat; new loss of smell or taste especially support the diagnosis of COVID-19)       Headache, body aches, loss taste and smell  Protocols used: CORONAVIRUS (COVID-19) DIAGNOSED OR SUSPECTED-A-AH

## 2020-03-09 ENCOUNTER — Telehealth (INDEPENDENT_AMBULATORY_CARE_PROVIDER_SITE_OTHER): Payer: HRSA Program | Admitting: Family Medicine

## 2020-03-09 DIAGNOSIS — R0600 Dyspnea, unspecified: Secondary | ICD-10-CM | POA: Diagnosis not present

## 2020-03-09 DIAGNOSIS — U071 COVID-19: Secondary | ICD-10-CM | POA: Diagnosis not present

## 2020-03-09 NOTE — Progress Notes (Signed)
Patient ID: Peter Mcclure, male   DOB: Jun 11, 1970, 49 y.o.   MRN: [id number]  This visit type was conducted due to national recommendations for restrictions regarding the COVID-19 pandemic in an effort to limit this patient's exposure and mitigate transmission in our community.   Virtual Visit via Telephone Note  I connected with Peter Mcclure on 03/09/20 at  4:30 PM EST by telephone and verified that I am speaking with the correct person using two identifiers.   I discussed the limitations, risks, security and privacy concerns of performing an evaluation and management service by telephone and the availability of in person appointments. I also discussed with the patient that there may be a patient responsible charge related to this service. The patient expressed understanding and agreed to proceed.  Location patient: home Location provider: work or home office Participants present for the call: patient, provider Patient did not have a visit in the prior 7 days to address this/these issue(s).   History of Present Illness: Peter Mcclure relates onset sometime around the 13th of this month with upper respiratory symptoms of cough, nasal congestion, fever.  He had loss of taste and smell for only couple of days.  He went Monday the 20th for Covid test to a local drive-through clinic and result did not come back until Saturday- positive.  He calls now because of some ongoing dyspnea with activity.  He has some type of watch that monitors oxygen levels but he is not sure about accuracy with this is showing low to mid 80s at times.  He actually is already scheduled to go to urgent care tomorrow to get chest x-ray and further evaluated.  No further fever.  Other than the dyspnea slowly improving.  He does have increased fatigue issues.  No nausea or vomiting.  He denies any asymmetric leg swelling, leg pain, pleuritic pain, hemoptysis.  He has other comorbidities including hypertension and type 2 diabetes but  unfortunately is past the 10-day window for monoclonal antibodies  Past Medical History:  Diagnosis Date  . Diabetes mellitus without complication (HCC)   . ED (erectile dysfunction)   . GERD (gastroesophageal reflux disease)   . Hepatic steatosis   . Hypertension   . Insomnia   . Pulmonary nodules    No past surgical history on file.  reports that he has never smoked. He has never used smokeless tobacco. He reports previous alcohol use of about 4.0 standard drinks of alcohol per week. He reports that he does not use drugs. family history includes Diabetes in an other family member; Hypertension in an other family member. No Known Allergies    Observations/Objective: Patient sounds cheerful and well on the phone. I do not appreciate any SOB. Speech and thought processing are grossly intact. Patient reported vitals:  Assessment and Plan:  COVID-19 infection.  He is over 10 days into this.  Having some persistent dyspnea  -Patient already scheduled to go to urgent care tomorrow to have chest x-ray and further evaluation -Suggest that he get home pulse oximeter to help monitor oxygen levels   Follow Up Instructions:  -As above   99441 [id number] [id number] 11-20 [id number] 21-30 I did not refer this patient for an OV in the next 24 hours for this/these issue(s).  I discussed the assessment and treatment plan with the patient. The patient was provided an opportunity to ask questions and all were answered. The patient agreed with the plan and demonstrated an understanding of the instructions.   The patient was  advised to call back or seek an in-person evaluation if the symptoms worsen or if the condition fails to improve as anticipated.  I provided 18 minutes of non-face-to-face time during this encounter.   Evelena Peat, MD

## 2020-03-10 ENCOUNTER — Telehealth: Payer: Self-pay | Admitting: Family Medicine

## 2020-03-10 DIAGNOSIS — R0600 Dyspnea, unspecified: Secondary | ICD-10-CM

## 2020-03-10 NOTE — Telephone Encounter (Signed)
Advised patient he could go to  [place] location for xray.

## 2020-03-10 NOTE — Telephone Encounter (Signed)
I placed order for chest x-ray for Peter Mcclure.  This gentleman did have Covid but he is almost 3 weeks and the symptoms and well past incubation.  He needs to get there before 5 PM if he goes to [facility]

## 2020-03-10 NOTE — Telephone Encounter (Signed)
Patient was not able to have X-Rays done at the Urgent Care today. They would not let him in the building because he tested positive for COVID.  The patient wants to know if Dr. Caryl Never can place an order for X-Rays here at our office.  Please advise

## 2020-03-16 ENCOUNTER — Ambulatory Visit: Payer: Self-pay | Admitting: Family Medicine

## 2020-03-19 ENCOUNTER — Ambulatory Visit (INDEPENDENT_AMBULATORY_CARE_PROVIDER_SITE_OTHER): Payer: Self-pay | Admitting: Family Medicine

## 2020-03-19 ENCOUNTER — Other Ambulatory Visit: Payer: Self-pay

## 2020-03-19 ENCOUNTER — Encounter: Payer: Self-pay | Admitting: Family Medicine

## 2020-03-19 VITALS — BP 132/82 | HR 89 | Wt 229.0 lb

## 2020-03-19 DIAGNOSIS — E1165 Type 2 diabetes mellitus with hyperglycemia: Secondary | ICD-10-CM

## 2020-03-19 DIAGNOSIS — U071 COVID-19: Secondary | ICD-10-CM

## 2020-03-19 DIAGNOSIS — R634 Abnormal weight loss: Secondary | ICD-10-CM

## 2020-03-19 LAB — POCT GLYCOSYLATED HEMOGLOBIN (HGB A1C): Hemoglobin A1C: 8.9 % — AB (ref 4.0–5.6)

## 2020-03-19 MED ORDER — GLIMEPIRIDE 2 MG PO TABS: 2.0000 mg | ORAL_TABLET | Freq: Every day | ORAL | 3 refills | Status: AC

## 2020-03-19 NOTE — Progress Notes (Signed)
Established Patient Office Visit  Subjective:  Patient ID: Peter Mcclure, male    DOB: 1970-06-15  Age: 50 y.o. MRN: [id number]  CC:  Chief Complaint  Patient presents with  . Follow-up    HPI Peter Mcclure presents for medical follow-up.  He had recent COVID-19 infection.  He had documentation of this being positive on 22 December.  He thinks he had onset of symptoms as early as the around the 11th.  He had fairly severe symptoms and states that his O2 sats at 1 point got down as low as 82%.  He had contemplated going to the ER but because of long weights never went.  His O2 sats are improved at this time.  He had loss of taste and smell but this is slowly recovering.  He had fairly dramatic weight loss of 23 pounds since November.  He states he also quit drinking alcohol recently.  He thinks perhaps he is eating more snack foods especially starchy type foods since he was here last.  His last A1c was 7.1%.  No significant polyuria or polydipsia.  He has also been somewhat inconsistent with taking metformin.  Past Medical History:  Diagnosis Date  . Diabetes mellitus without complication (HCC)   . ED (erectile dysfunction)   . GERD (gastroesophageal reflux disease)   . Hepatic steatosis   . Hypertension   . Insomnia   . Pulmonary nodules     No past surgical history on file.  Family History  Problem Relation Age of Onset  . Diabetes Other   . Hypertension Other   . Colon cancer Neg Hx   . Stomach cancer Neg Hx   . Esophageal cancer Neg Hx   . Pancreatic cancer Neg Hx     Social History   Socioeconomic History  . Marital status: Married    Spouse name: Not on file  . Number of children: Not on file  . Years of education: Not on file  . Highest education level: Not on file  Occupational History  . Not on file  Tobacco Use  . Smoking status: Never Smoker  . Smokeless tobacco: Never Used  Vaping Use  . Vaping Use: Never used  Substance and Sexual Activity  . Alcohol  use: Not Currently    Alcohol/week: 4.0 standard drinks    Types: 4 Cans of beer per week    Comment: 4-6 beers a day  . Drug use: No  . Sexual activity: Never  Other Topics Concern  . Not on file  Social History Narrative  . Not on file   Social Determinants of Health   Financial Resource Strain: Not on file  Food Insecurity: Not on file  Transportation Needs: Not on file  Physical Activity: Not on file  Stress: Not on file  Social Connections: Not on file  Intimate Partner Violence: Not on file    Outpatient Medications Prior to Visit  Medication Sig Dispense Refill  . acetaminophen (TYLENOL) 500 MG tablet Take 500-1,000 mg by mouth every 6 (six) hours as needed for moderate pain.     Marland Kitchen ibuprofen (ADVIL) 200 MG tablet Take 400-800 mg by mouth every 6 (six) hours as needed (knee).    Marland Kitchen lisinopril (ZESTRIL) 20 MG tablet Take 20 mg by mouth daily.    . metFORMIN (GLUCOPHAGE) 500 MG tablet Take 2 tablets by mouth twice daily (Patient taking differently: Take 2,000 mg by mouth 2 (two) times daily with a meal.) 360 tablet 0  .  metoprolol succinate (TOPROL-XL) 25 MG 24 hr tablet TAKE 1 TABLET BY MOUTH ONCE DAILY AS NEEDED FOR  INCREASED  PALPITATIONS 90 tablet 0  . pantoprazole (PROTONIX) 40 MG tablet Take 1 tablet (40 mg total) by mouth daily. 90 tablet 3  . rosuvastatin (CRESTOR) 10 MG tablet Take 1 tablet (10 mg total) by mouth daily. 90 tablet 3  . sildenafil (VIAGRA) 100 MG tablet Take one half to one tablet daily as needed for erectile dysfunction. 10 tablet 11  . traZODone (DESYREL) 50 MG tablet Take 0.5-1 tablets (25-50 mg total) by mouth at bedtime as needed for sleep. 30 tablet 3  . lisinopril (ZESTRIL) 10 MG tablet Take 1 tablet by mouth once daily 90 tablet 0   No facility-administered medications prior to visit.    No Known Allergies  ROS Review of Systems  Constitutional: Negative for fatigue.  Eyes: Negative for visual disturbance.  Respiratory: Negative for  cough, chest tightness and shortness of breath.   Cardiovascular: Negative for chest pain, palpitations and leg swelling.  Neurological: Negative for dizziness, syncope, weakness, light-headedness and headaches.      Objective:    Physical Exam Constitutional:      Appearance: He is well-developed and well-nourished.  HENT:     Right Ear: External ear normal.     Left Ear: External ear normal.     Mouth/Throat:     Mouth: Oropharynx is clear and moist.  Eyes:     Pupils: Pupils are equal, round, and reactive to light.  Neck:     Thyroid: No thyromegaly.  Cardiovascular:     Rate and Rhythm: Normal rate and regular rhythm.  Pulmonary:     Effort: Pulmonary effort is normal. No respiratory distress.     Breath sounds: Normal breath sounds. No wheezing or rales.  Musculoskeletal:        General: No edema.     Cervical back: Neck supple.  Neurological:     Mental Status: He is alert and oriented to person, place, and time.     BP 132/82   Pulse 89   Wt 229 lb (103.9 kg)   SpO2 97%   BMI 27.51 kg/m  Wt Readings from Last 3 Encounters:  03/19/20 229 lb (103.9 kg)  01/21/20 249 lb (112.9 kg)  01/14/20 252 lb 6.4 oz (114.5 kg)     Health Maintenance Due  Topic Date Due  . Hepatitis C Screening  Never done  . COVID-19 Vaccine (1) Never done  . HIV Screening  Never done  . OPHTHALMOLOGY EXAM  02/17/2014  . COLONOSCOPY (Pts 45-30yrs Insurance coverage will need to be confirmed)  Never done  . FOOT EXAM  10/28/2019    There are no preventive care reminders to display for this patient.  Lab Results  Component Value Date   TSH 1.841 07/19/2019   Lab Results  Component Value Date   WBC 7.3 01/05/2020   HGB 13.4 01/05/2020   HCT 39.3 01/05/2020   MCV 94.0 01/05/2020   PLT 273 01/05/2020   Lab Results  Component Value Date   NA 135 01/05/2020   K 3.9 01/05/2020   CO2 22 01/05/2020   GLUCOSE 183 (H) 01/05/2020   BUN 14 01/05/2020   CREATININE 0.68 01/05/2020    BILITOT 0.8 07/19/2019   ALKPHOS 81 07/19/2019   AST 28 07/19/2019   ALT 46 (H) 07/19/2019   PROT 7.2 07/19/2019   ALBUMIN 4.1 07/19/2019   CALCIUM 9.1 01/05/2020  ANIONGAP 10 01/05/2020   GFR 132.53 10/28/2018   Lab Results  Component Value Date   CHOL 152 10/28/2018   Lab Results  Component Value Date   HDL 41.50 10/28/2018   Lab Results  Component Value Date   LDLCALC 75 10/28/2018   Lab Results  Component Value Date   TRIG 179.0 (H) 10/28/2018   Lab Results  Component Value Date   CHOLHDL 4 10/28/2018   Lab Results  Component Value Date   HGBA1C 8.9 (A) 03/19/2020      Assessment & Plan:   #1 type 2 diabetes.  He has had significant weight loss which we initially attributed to COVID.  This may be partly related to COVID but also question with his A1c being up today at 8.9% whether he has had poor control of his sugars which have exacerbated the weight loss.  He also did recently give up alcohol but states he has been perhaps eating more high carb foods.  -We recommended first that he get back on regular use of metformin -We discussed at least short-term use of Amaryl 2 mg once daily.  He currently has no insurance and we have to be cognizant of cost -We strongly advise he scaled back sugar and starch intake and hopefully can start back exercising soon -Recommend 58-month follow-up and sooner as needed  #2 recent COVID infection clinically improved.  O2 sats today 97%.  He is encouraged to gradually build back his exercise and activity levels.  #3 weight loss.  We suspect mostly related to his severe COVID infection but blood sugars also out of control which may be contributing.  He had scaled back alcohol use recently but has been snacking more  Meds ordered this encounter  Medications  . glimepiride (AMARYL) 2 MG tablet    Sig: Take 1 tablet (2 mg total) by mouth daily before breakfast.    Dispense:  30 tablet    Refill:  3    Follow-up: Return in  about 2 months (around 05/17/2020).    Carolann Littler, MD

## 2020-03-19 NOTE — Patient Instructions (Signed)

## 2020-03-25 ENCOUNTER — Other Ambulatory Visit: Payer: Self-pay | Admitting: Family Medicine

## 2020-04-14 ENCOUNTER — Other Ambulatory Visit: Payer: Self-pay | Admitting: Family Medicine

## 2020-04-16 ENCOUNTER — Other Ambulatory Visit: Payer: Self-pay

## 2020-04-16 MED ORDER — LISINOPRIL 20 MG PO TABS
20.0000 mg | ORAL_TABLET | Freq: Every day | ORAL | 3 refills | Status: DC
Start: 2020-04-16 — End: 2021-03-08

## 2020-05-17 ENCOUNTER — Ambulatory Visit: Payer: Self-pay | Admitting: Family Medicine

## 2020-05-17 DIAGNOSIS — Z0289 Encounter for other administrative examinations: Secondary | ICD-10-CM

## 2020-05-18 ENCOUNTER — Other Ambulatory Visit: Payer: Self-pay | Admitting: Family Medicine

## 2020-06-15 ENCOUNTER — Other Ambulatory Visit: Payer: Self-pay | Admitting: Family Medicine

## 2020-08-14 ENCOUNTER — Other Ambulatory Visit: Payer: Self-pay | Admitting: Family Medicine

## 2020-09-14 ENCOUNTER — Other Ambulatory Visit: Payer: Self-pay | Admitting: Family Medicine

## 2020-10-10 ENCOUNTER — Other Ambulatory Visit: Payer: Self-pay | Admitting: Family Medicine

## 2020-10-29 IMAGING — CR DG CHEST 2V
2 series · 2 of 2 positions shown · non-contrast
Comparison: 06/18/2018

CLINICAL DATA: Chest pain

EXAM:
CHEST - 2 VIEW

[w chest pa]
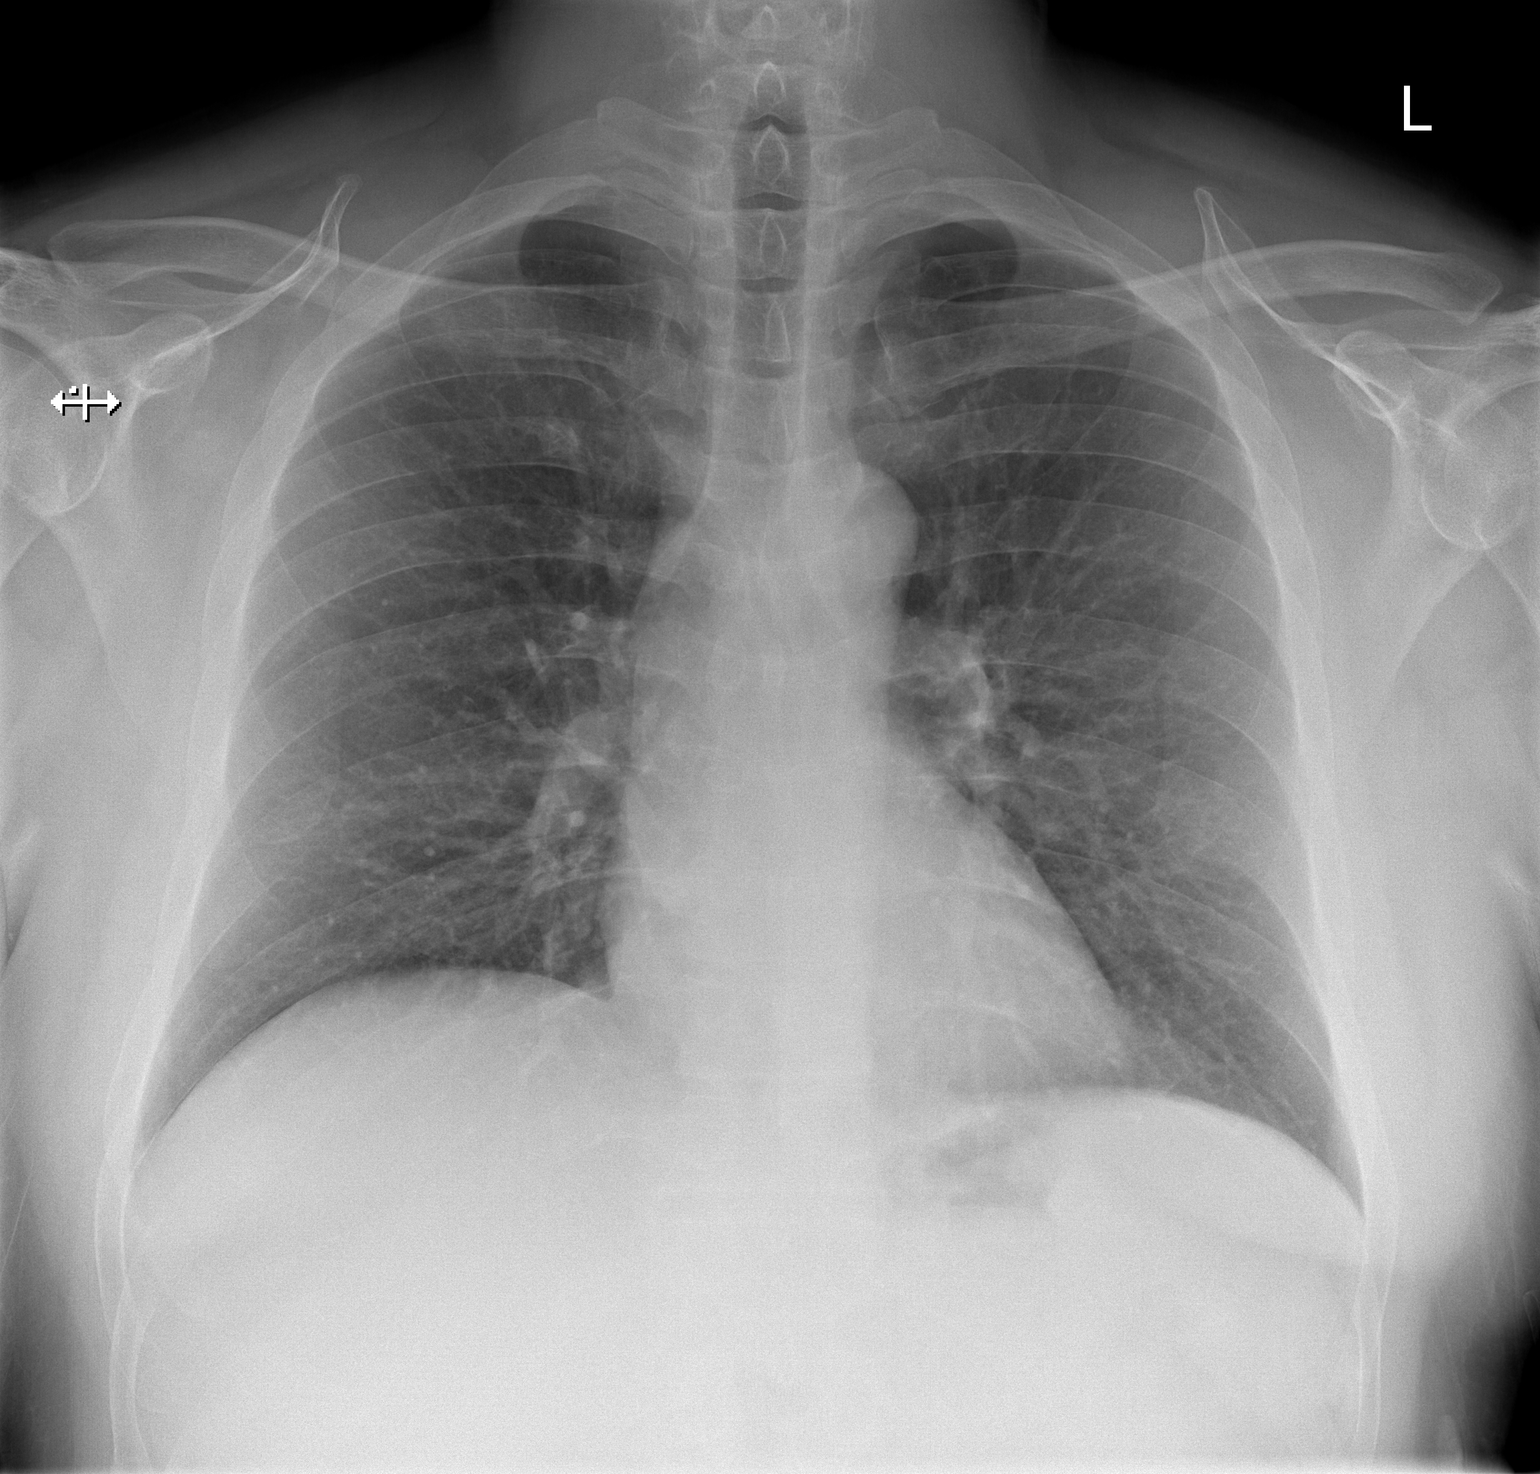

[w chest lat]
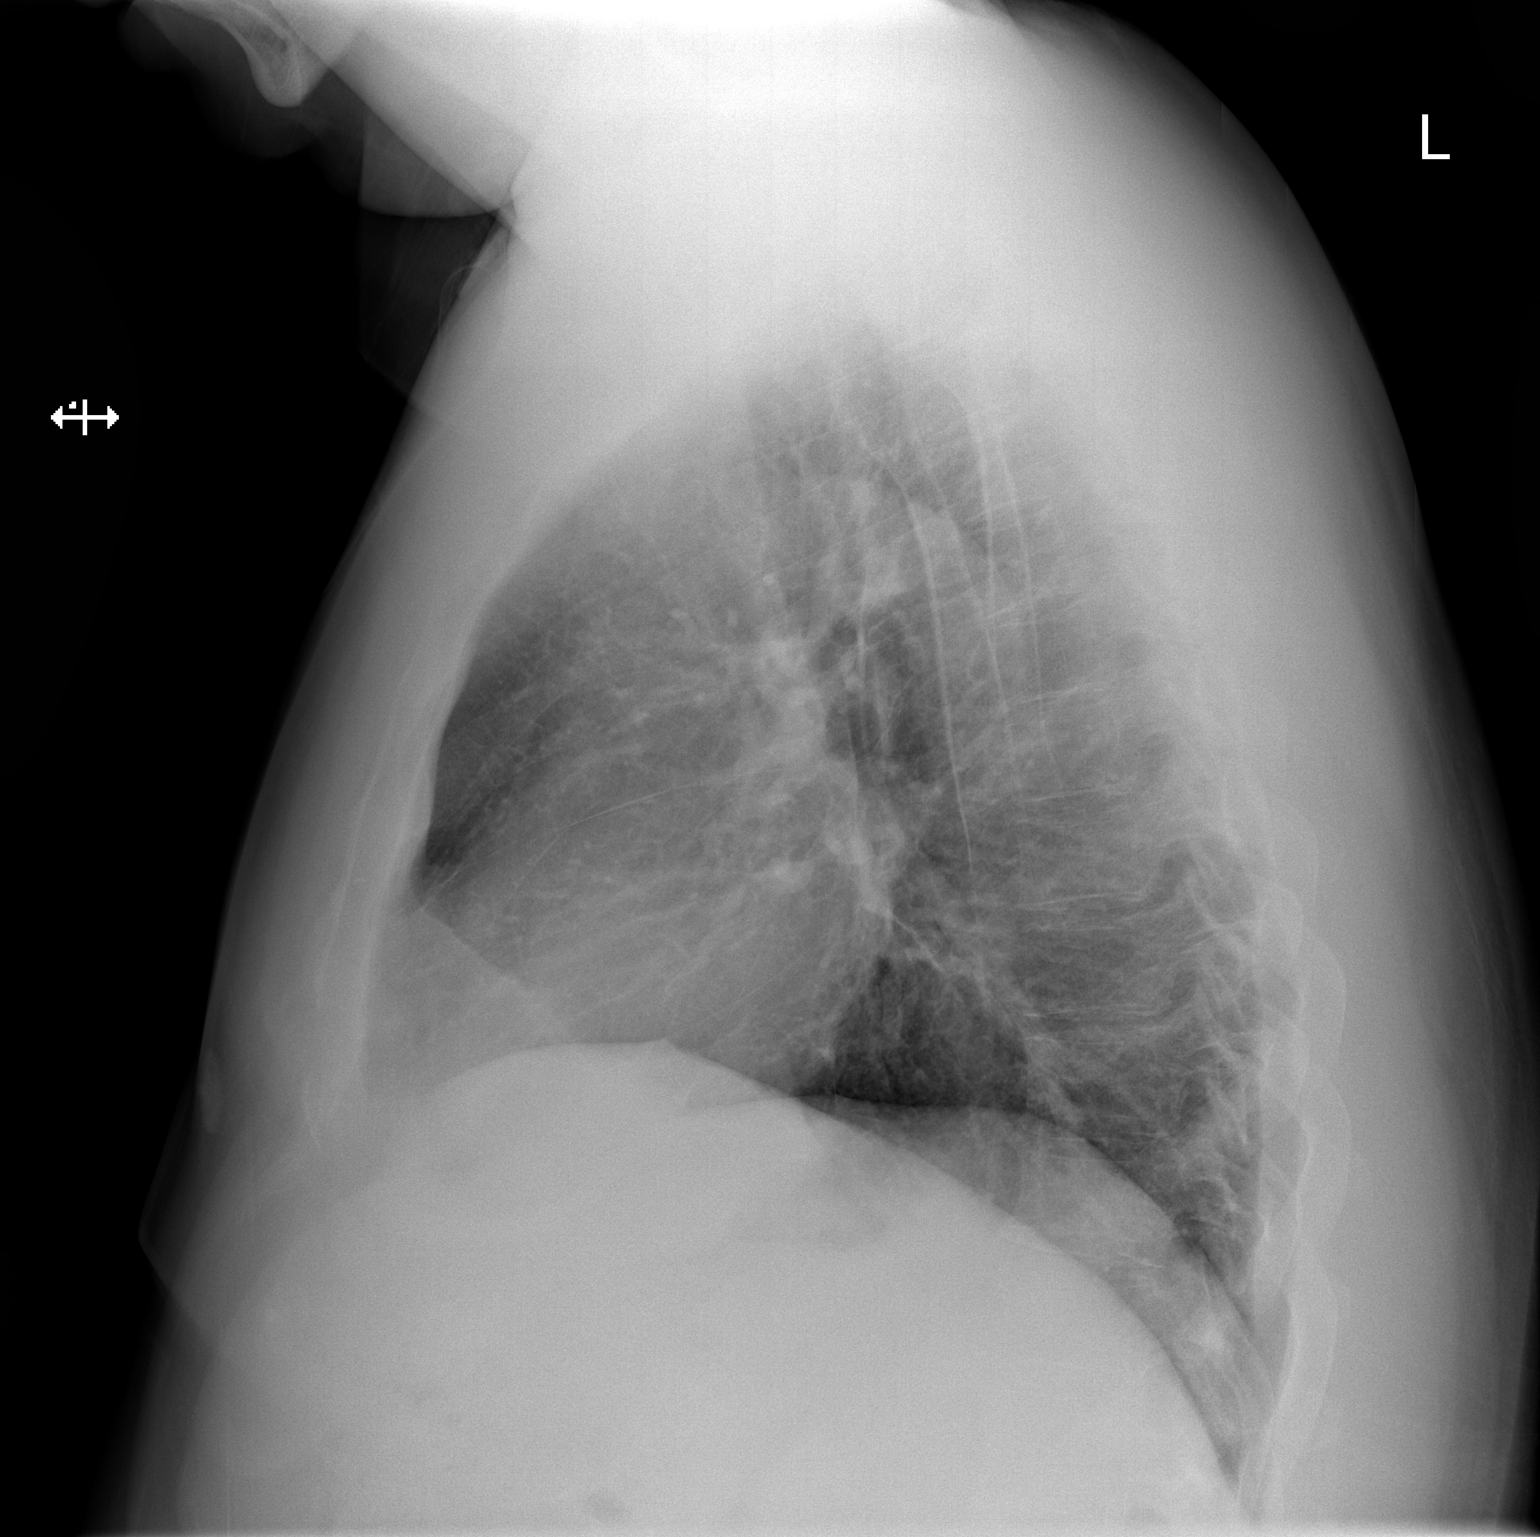

[2 of 2 positions shown; findings below may reference images not displayed]

FINDINGS: The heart size and mediastinal contours are within normal limits.
Both lungs are clear. The visualized skeletal structures are
unremarkable.
IMPRESSION: No active cardiopulmonary disease.

## 2020-11-23 ENCOUNTER — Other Ambulatory Visit: Payer: Self-pay | Admitting: Family Medicine

## 2021-01-23 ENCOUNTER — Other Ambulatory Visit: Payer: Self-pay | Admitting: Family Medicine

## 2021-02-17 ENCOUNTER — Other Ambulatory Visit: Payer: Self-pay | Admitting: Family Medicine

## 2021-03-06 ENCOUNTER — Other Ambulatory Visit: Payer: Self-pay | Admitting: Family Medicine

## 2021-03-10 ENCOUNTER — Other Ambulatory Visit: Payer: Self-pay | Admitting: Family Medicine

## 2021-05-02 ENCOUNTER — Ambulatory Visit: Payer: Self-pay | Admitting: Family Medicine

## 2021-05-06 ENCOUNTER — Encounter: Payer: Self-pay | Admitting: Family Medicine

## 2021-05-06 ENCOUNTER — Ambulatory Visit (INDEPENDENT_AMBULATORY_CARE_PROVIDER_SITE_OTHER): Payer: Self-pay | Admitting: Family Medicine

## 2021-05-06 VITALS — BP 134/88 | HR 86 | Temp 97.7°F | Ht 76.5 in | Wt 249.4 lb

## 2021-05-06 DIAGNOSIS — I10 Essential (primary) hypertension: Secondary | ICD-10-CM

## 2021-05-06 DIAGNOSIS — E785 Hyperlipidemia, unspecified: Secondary | ICD-10-CM

## 2021-05-06 DIAGNOSIS — E1165 Type 2 diabetes mellitus with hyperglycemia: Secondary | ICD-10-CM

## 2021-05-06 DIAGNOSIS — K219 Gastro-esophageal reflux disease without esophagitis: Secondary | ICD-10-CM

## 2021-05-06 LAB — HEPATIC FUNCTION PANEL
ALT: 27 U/L (ref 0–53)
AST: 22 U/L (ref 0–37)
Albumin: 4.6 g/dL (ref 3.5–5.2)
Alkaline Phosphatase: 60 U/L (ref 39–117)
Bilirubin, Direct: 0.1 mg/dL (ref 0.0–0.3)
Total Bilirubin: 0.8 mg/dL (ref 0.2–1.2)
Total Protein: 7.7 g/dL (ref 6.0–8.3)

## 2021-05-06 LAB — LIPID PANEL
Cholesterol: 148 mg/dL (ref 0–200)
HDL: 44.6 mg/dL (ref >39.00)
LDL Cholesterol: 75 mg/dL (ref 0–99)
NonHDL: 103.25
Total CHOL/HDL Ratio: 3
Triglycerides: 139 mg/dL (ref 0.0–149.0)
VLDL: 27.8 mg/dL (ref 0.0–40.0)

## 2021-05-06 LAB — BASIC METABOLIC PANEL
BUN: 14 mg/dL (ref 6–23)
CO2: 27 mEq/L (ref 19–32)
Calcium: 9.3 mg/dL (ref 8.4–10.5)
Chloride: 103 mEq/L (ref 96–112)
Creatinine, Ser: 0.79 mg/dL (ref 0.40–1.50)
GFR: 103.67 mL/min (ref >60.00)
Glucose, Bld: 174 mg/dL — ABNORMAL HIGH (ref 70–99)
Potassium: 3.9 mEq/L (ref 3.5–5.1)
Sodium: 136 mEq/L (ref 135–145)

## 2021-05-06 LAB — HEMOGLOBIN A1C: Hgb A1c MFr Bld: 7 % — ABNORMAL HIGH (ref 4.6–6.5)

## 2021-05-06 MED ORDER — SILDENAFIL CITRATE 100 MG PO TABS: ORAL_TABLET | ORAL | 11 refills | Status: DC

## 2021-05-06 MED ORDER — METOPROLOL SUCCINATE ER 25 MG PO TB24: ORAL_TABLET | ORAL | 5 refills | Status: DC

## 2021-05-06 MED ORDER — PANTOPRAZOLE SODIUM 40 MG PO TBEC: 40.0000 mg | DELAYED_RELEASE_TABLET | Freq: Every day | ORAL | 5 refills | Status: DC

## 2021-05-06 NOTE — Progress Notes (Signed)
Established Patient Office Visit  Subjective:  Patient ID: Peter Mcclure, male    DOB: 1971-02-09  Age: 51 y.o. MRN: [id number]  CC:  Chief Complaint  Patient presents with   Nausea    Patient complains of nausea, x1 week,    Health Maintenance    HPI [facility] presents for medical follow-up and also complaint of nausea intermittently for past week.  He has had some concomitant dizziness which sounds like some mild vertigo.  No clear consistent triggers.  He denies any visual changes, headache, focal weakness, speech change, vision change, ataxia, dysphagia.  He has chronic problems including hypertension, GERD, type 2 diabetes, hyperlipidemia.  Still has no insurance coverage.  Started a new job recently.  Hopes to have coverage soon.  Poor compliance with medications including metformin at times.  He did not take his lisinopril this morning.  No longer taking Protonix and recently has had some increased daily reflux symptoms.  Not relieved with over-the-counter antiacids.  No recent chest pains.  Not monitoring blood sugars.  Last A1c very poorly controlled with A1c of 8.9%.  He remains on rosuvastatin 10 mg daily.  Past Medical History:  Diagnosis Date   Diabetes mellitus without complication (HCC)    ED (erectile dysfunction)    GERD (gastroesophageal reflux disease)    Hepatic steatosis    Hypertension    Insomnia    Pulmonary nodules     History reviewed. No pertinent surgical history.  Family History  Problem Relation Age of Onset   Diabetes Other    Hypertension Other    Colon cancer Neg Hx    Stomach cancer Neg Hx    Esophageal cancer Neg Hx    Pancreatic cancer Neg Hx     Social History   Socioeconomic History   Marital status: Married    Spouse name: Not on file   Number of children: Not on file   Years of education: Not on file   Highest education level: Not on file  Occupational History   Not on file  Tobacco Use   Smoking status: Never   Smokeless  tobacco: Never  Vaping Use   Vaping Use: Never used  Substance and Sexual Activity   Alcohol use: Not Currently    Alcohol/week: 4.0 standard drinks    Types: 4 Cans of beer per week    Comment: 4-6 beers a day   Drug use: No   Sexual activity: Never  Other Topics Concern   Not on file  Social History Narrative   Not on file   Social Determinants of Health   Financial Resource Strain: Not on file  Food Insecurity: Not on file  Transportation Needs: Not on file  Physical Activity: Not on file  Stress: Not on file  Social Connections: Not on file  Intimate Partner Violence: Not on file    Outpatient Medications Prior to Visit  Medication Sig Dispense Refill   acetaminophen (TYLENOL) 500 MG tablet Take 500-1,000 mg by mouth every 6 (six) hours as needed for moderate pain.      glimepiride (AMARYL) 2 MG tablet Take 1 tablet (2 mg total) by mouth daily before breakfast. 30 tablet 3   lisinopril (ZESTRIL) 20 MG tablet Take 1 tablet by mouth once daily 90 tablet 0   metFORMIN (GLUCOPHAGE) 500 MG tablet Take 2 tablets by mouth twice daily 360 tablet 3   rosuvastatin (CRESTOR) 10 MG tablet Take 1 tablet by mouth once daily 90 tablet 0  traZODone (DESYREL) 50 MG tablet Take 0.5-1 tablets (25-50 mg total) by mouth at bedtime as needed for sleep. 30 tablet 3   ibuprofen (ADVIL) 200 MG tablet Take 400-800 mg by mouth every 6 (six) hours as needed (knee).     metoprolol succinate (TOPROL-XL) 25 MG 24 hr tablet TAKE 1 TABLET BY MOUTH ONCE DAILY AS NEEDED FOR  INCREASED  PALPITATIONS 90 tablet 0   pantoprazole (PROTONIX) 40 MG tablet Take 1 tablet (40 mg total) by mouth daily. 90 tablet 3   sildenafil (VIAGRA) 100 MG tablet Take one half to one tablet daily as needed for erectile dysfunction. 10 tablet 11   No facility-administered medications prior to visit.    No Known Allergies  ROS Review of Systems  Constitutional:  Negative for fatigue.  Eyes:  Negative for visual disturbance.   Respiratory:  Negative for cough, chest tightness and shortness of breath.   Cardiovascular:  Negative for chest pain, palpitations and leg swelling.  Gastrointestinal:  Positive for nausea. Negative for abdominal pain, constipation, diarrhea and vomiting.  Endocrine: Negative for polydipsia and polyuria.  Neurological:  Positive for dizziness. Negative for syncope, weakness, light-headedness and headaches.     Objective:    Physical Exam Constitutional:      Appearance: He is well-developed.  HENT:     Right Ear: External ear normal.     Left Ear: External ear normal.  Eyes:     Pupils: Pupils are equal, round, and reactive to light.  Neck:     Thyroid: No thyromegaly.  Cardiovascular:     Rate and Rhythm: Normal rate and regular rhythm.  Pulmonary:     Effort: Pulmonary effort is normal. No respiratory distress.     Breath sounds: Normal breath sounds. No wheezing or rales.  Musculoskeletal:     Cervical back: Neck supple.  Neurological:     General: No focal deficit present.     Mental Status: He is alert and oriented to person, place, and time.     Cranial Nerves: No cranial nerve deficit.     Motor: No weakness.     Coordination: Coordination normal.     Gait: Gait normal.  Psychiatric:        Mood and Affect: Mood normal.        Thought Content: Thought content normal.    BP 134/88 (BP Location: Left Arm, Patient Position: Sitting, Cuff Size: Normal)    Pulse 86    Temp 97.7 F (36.5 C) (Oral)    Ht 6' 4.5" (1.943 m)    Wt 249 lb 6.4 oz (113.1 kg)    SpO2 97%    BMI 29.96 kg/m  Wt Readings from Last 3 Encounters:  05/06/21 249 lb 6.4 oz (113.1 kg)  03/19/20 229 lb (103.9 kg)  01/21/20 249 lb (112.9 kg)     Health Maintenance Due  Topic Date Due   HIV Screening  Never done   Hepatitis C Screening  Never done   OPHTHALMOLOGY EXAM  02/17/2014   COLONOSCOPY (Pts 45-30yrs Insurance coverage will need to be confirmed)  Never done   FOOT EXAM  10/28/2019    COVID-19 Vaccine (2 - Pfizer series) 07/07/2020   HEMOGLOBIN A1C  09/16/2020   INFLUENZA VACCINE  10/11/2020   Zoster Vaccines- Shingrix (1 of 2) Never done    There are no preventive care reminders to display for this patient.  Lab Results  Component Value Date   TSH 1.841 07/19/2019   Lab  Results  Component Value Date   WBC 7.3 01/05/2020   HGB 13.4 01/05/2020   HCT 39.3 01/05/2020   MCV 94.0 01/05/2020   PLT 273 01/05/2020   Lab Results  Component Value Date   NA 135 01/05/2020   K 3.9 01/05/2020   CO2 22 01/05/2020   GLUCOSE 183 (H) 01/05/2020   BUN 14 01/05/2020   CREATININE 0.68 01/05/2020   BILITOT 0.8 07/19/2019   ALKPHOS 81 07/19/2019   AST 28 07/19/2019   ALT 46 (H) 07/19/2019   PROT 7.2 07/19/2019   ALBUMIN 4.1 07/19/2019   CALCIUM 9.1 01/05/2020   ANIONGAP 10 01/05/2020   GFR 132.53 10/28/2018   Lab Results  Component Value Date   CHOL 152 10/28/2018   Lab Results  Component Value Date   HDL 41.50 10/28/2018   Lab Results  Component Value Date   LDLCALC 75 10/28/2018   Lab Results  Component Value Date   TRIG 179.0 (H) 10/28/2018   Lab Results  Component Value Date   CHOLHDL 4 10/28/2018   Lab Results  Component Value Date   HGBA1C 8.9 (A) 03/19/2020      Assessment & Plan:   #1 intermittent dizziness.  This sounds like vertigo.  Mild associated nausea.  Nonfocal exam.  Suspect benign peripheral positional vertigo.  Symptoms currently stable and improving.  Observe for now.  Reviewed signs and symptoms of more worrisome vertigo  #2 type 2 diabetes.  History of poor control.  History of poor compliance.  Recheck A1c.  He is currently in process of trying to get coverage.  We would love to use SGLT2 and/or GLP-1 medication if indicated  #3 history of intermittent palpitations.  Refill metoprolol for as needed use  #4 GERD.  Recent increase symptoms.  Refill Protonix 40 mg daily.  If symptoms not fully resolved in 2 weeks be in  touch.  #5 hyperlipidemia.  Overdue for labs.  Recheck lipid and hepatic panel.  Continue rosuvastatin 10 mg daily   Meds ordered this encounter  Medications   metoprolol succinate (TOPROL-XL) 25 MG 24 hr tablet    Sig: Take one tablet by mouth daily as needed for palpitations.    Dispense:  30 tablet    Refill:  5   pantoprazole (PROTONIX) 40 MG tablet    Sig: Take 1 tablet (40 mg total) by mouth daily.    Dispense:  30 tablet    Refill:  5   sildenafil (VIAGRA) 100 MG tablet    Sig: Take one half to one tablet daily as needed for erectile dysfunction.    Dispense:  10 tablet    Refill:  11    Follow-up: No follow-ups on file.    Carolann Littler, MD

## 2021-05-20 ENCOUNTER — Telehealth: Payer: Self-pay

## 2021-05-20 MED ORDER — METFORMIN HCL 500 MG PO TABS: 1000.0000 mg | ORAL_TABLET | Freq: Two times a day (BID) | ORAL | 3 refills | Status: DC

## 2021-05-20 NOTE — Telephone Encounter (Signed)
Rx sent 

## 2021-05-20 NOTE — Telephone Encounter (Signed)
Hi I couldn?t ask for a refill of my metformin threw my chart, can you ask Dr to refill it please ? ? ?Medication was last filled 03/25/20. Will send to PCP for approval. Last OV 05/06/21: ?#2 type 2 diabetes.  History of poor control.  History of poor compliance.  Recheck A1c.  He is currently in process of trying to get coverage.  We would love to use SGLT2 and/or GLP-1 medication if indicated ?

## 2021-05-25 ENCOUNTER — Other Ambulatory Visit: Payer: Self-pay | Admitting: Family Medicine

## 2021-06-02 ENCOUNTER — Other Ambulatory Visit: Payer: Self-pay | Admitting: Family Medicine

## 2021-06-07 ENCOUNTER — Encounter: Payer: Self-pay | Admitting: Family Medicine

## 2021-06-08 MED ORDER — TRAZODONE HCL 50 MG PO TABS: 25.0000 mg | ORAL_TABLET | Freq: Every evening | ORAL | 3 refills | Status: AC | PRN

## 2021-06-08 NOTE — Telephone Encounter (Signed)
Trazodone sent.

## 2021-08-12 ENCOUNTER — Ambulatory Visit: Payer: Self-pay | Admitting: Family Medicine

## 2021-09-01 ENCOUNTER — Other Ambulatory Visit: Payer: Self-pay | Admitting: Family Medicine

## 2021-09-01 MED ORDER — ROSUVASTATIN CALCIUM 10 MG PO TABS: 10.0000 mg | ORAL_TABLET | Freq: Every day | ORAL | 0 refills | Status: DC

## 2021-09-01 MED ORDER — LISINOPRIL 20 MG PO TABS: 20.0000 mg | ORAL_TABLET | Freq: Every day | ORAL | 0 refills | Status: DC

## 2021-11-04 ENCOUNTER — Ambulatory Visit: Payer: Self-pay | Admitting: Family Medicine

## 2021-12-02 ENCOUNTER — Ambulatory Visit: Payer: Self-pay | Admitting: Family Medicine

## 2021-12-20 ENCOUNTER — Other Ambulatory Visit: Payer: Self-pay | Admitting: Family Medicine

## 2022-01-18 ENCOUNTER — Encounter: Payer: Self-pay | Admitting: Family Medicine

## 2022-01-22 ENCOUNTER — Other Ambulatory Visit: Payer: Self-pay | Admitting: Family Medicine

## 2022-02-08 ENCOUNTER — Encounter: Payer: Self-pay | Admitting: Family Medicine

## 2022-03-28 ENCOUNTER — Other Ambulatory Visit: Payer: Self-pay | Admitting: Family Medicine

## 2022-04-09 ENCOUNTER — Other Ambulatory Visit: Payer: Self-pay | Admitting: Family Medicine

## 2022-05-06 ENCOUNTER — Other Ambulatory Visit: Payer: Self-pay | Admitting: Family Medicine

## 2022-05-21 ENCOUNTER — Other Ambulatory Visit: Payer: Self-pay | Admitting: Family Medicine

## 2022-09-01 ENCOUNTER — Encounter: Payer: Self-pay | Admitting: Family Medicine

## 2022-09-01 ENCOUNTER — Ambulatory Visit (INDEPENDENT_AMBULATORY_CARE_PROVIDER_SITE_OTHER): Payer: Self-pay | Admitting: Family Medicine

## 2022-09-01 VITALS — BP 120/70 | HR 105 | Temp 97.7°F | Ht 76.0 in | Wt 237.8 lb

## 2022-09-01 DIAGNOSIS — Z7984 Long term (current) use of oral hypoglycemic drugs: Secondary | ICD-10-CM

## 2022-09-01 DIAGNOSIS — E1165 Type 2 diabetes mellitus with hyperglycemia: Secondary | ICD-10-CM

## 2022-09-01 DIAGNOSIS — E785 Hyperlipidemia, unspecified: Secondary | ICD-10-CM

## 2022-09-01 DIAGNOSIS — Z0000 Encounter for general adult medical examination without abnormal findings: Secondary | ICD-10-CM

## 2022-09-01 LAB — HEPATIC FUNCTION PANEL
ALT: 23 U/L (ref 0–53)
AST: 18 U/L (ref 0–37)
Albumin: 4.7 g/dL (ref 3.5–5.2)
Alkaline Phosphatase: 59 U/L (ref 39–117)
Bilirubin, Direct: 0.2 mg/dL (ref 0.0–0.3)
Total Bilirubin: 1.1 mg/dL (ref 0.2–1.2)
Total Protein: 8.2 g/dL (ref 6.0–8.3)

## 2022-09-01 LAB — LIPID PANEL
Cholesterol: 233 mg/dL — ABNORMAL HIGH (ref 0–200)
HDL: 47.3 mg/dL (ref >39.00)
LDL Cholesterol: 146 mg/dL — ABNORMAL HIGH (ref 0–99)
NonHDL: 185.2
Total CHOL/HDL Ratio: 5
Triglycerides: 197 mg/dL — ABNORMAL HIGH (ref 0.0–149.0)
VLDL: 39.4 mg/dL (ref 0.0–40.0)

## 2022-09-01 LAB — CBC WITH DIFFERENTIAL/PLATELET
Basophils Absolute: 0 10*3/uL (ref 0.0–0.1)
Basophils Relative: 0.6 % (ref 0.0–3.0)
Eosinophils Absolute: 0.1 10*3/uL (ref 0.0–0.7)
Eosinophils Relative: 1.5 % (ref 0.0–5.0)
HCT: 44 % (ref 39.0–52.0)
Hemoglobin: 14.8 g/dL (ref 13.0–17.0)
Lymphocytes Relative: 28.1 % (ref 12.0–46.0)
Lymphs Abs: 1.7 10*3/uL (ref 0.7–4.0)
MCHC: 33.6 g/dL (ref 30.0–36.0)
MCV: 97.8 fl (ref 78.0–100.0)
Monocytes Absolute: 0.5 10*3/uL (ref 0.1–1.0)
Monocytes Relative: 7.9 % (ref 3.0–12.0)
Neutro Abs: 3.7 10*3/uL (ref 1.4–7.7)
Neutrophils Relative %: 61.9 % (ref 43.0–77.0)
Platelets: 280 10*3/uL (ref 150.0–400.0)
RBC: 4.51 Mil/uL (ref 4.22–5.81)
RDW: 11.8 % (ref 11.5–15.5)
WBC: 6 10*3/uL (ref 4.0–10.5)

## 2022-09-01 LAB — BASIC METABOLIC PANEL
BUN: 9 mg/dL (ref 6–23)
CO2: 26 mEq/L (ref 19–32)
Calcium: 9.5 mg/dL (ref 8.4–10.5)
Chloride: 102 mEq/L (ref 96–112)
Creatinine, Ser: 0.88 mg/dL (ref 0.40–1.50)
GFR: 99.42 mL/min (ref >60.00)
Glucose, Bld: 205 mg/dL — ABNORMAL HIGH (ref 70–99)
Potassium: 4 mEq/L (ref 3.5–5.1)
Sodium: 139 mEq/L (ref 135–145)

## 2022-09-01 LAB — MICROALBUMIN / CREATININE URINE RATIO
Creatinine,U: 301.6 mg/dL
Microalb Creat Ratio: 7.5 mg/g (ref 0.0–30.0)
Microalb, Ur: 22.6 mg/dL — ABNORMAL HIGH (ref 0.0–1.9)

## 2022-09-01 LAB — HEMOGLOBIN A1C: Hgb A1c MFr Bld: 8.1 % — ABNORMAL HIGH (ref 4.6–6.5)

## 2022-09-01 LAB — PSA: PSA: 0.68 ng/mL (ref 0.10–4.00)

## 2022-09-01 NOTE — Patient Instructions (Signed)
Consider Cologuard or Colonoscopy at some point this year.

## 2022-09-01 NOTE — Progress Notes (Unsigned)
Established Patient Office Visit  Subjective   Patient ID: Peter Mcclure, male    DOB: [birth year]  Age: 52 y.o. MRN: [id number]  Chief Complaint  Patient presents with   Annual Exam    HPI  {History (Optional):23778} Peter Mcclure is seen for physical exam.  He has history of hypertension, GERD, type 2 diabetes, hyperlipidemia.  He has been out of his medications now for several months.  Very stressful year.  He states on May 31 of last year his girlfriend passed away suddenly and unexpectedly.  Unknown etiology.  He has had difficulties adjusting since then.  At 1 point he was drinking fairly heavily and not exercising.  Is now back on a better path with exercising some and eating better.  Basically ran out of all of his medications months ago.  He has lost some weight and is concerned that his blood sugars may be out of control.  No significant polyuria or polydipsia.  He has never had colon cancer screening.  Currently does not have insurance coverage.  Health Maintenance  Topic Date Due   HIV Screening  Never done   Hepatitis C Screening  Never done   OPHTHALMOLOGY EXAM  02/17/2014   Colonoscopy  Never done   Diabetic kidney evaluation - Urine ACR  10/28/2019   FOOT EXAM  10/28/2019   HEMOGLOBIN A1C  11/03/2021   COVID-19 Vaccine (2 - 2023-24 season) 11/11/2021   Diabetic kidney evaluation - eGFR measurement  05/06/2022   Zoster Vaccines- Shingrix (1 of 2) 12/02/2022 (Originally 12/16/2020)   INFLUENZA VACCINE  10/12/2022   DTaP/Tdap/Td (3 - Td or Tdap) 10/27/2028   HPV VACCINES  Aged Out   Social history-divorced.  Girlfriend died little over a year ago as above.  Non-smoker.   Family history reviewed with no significant changes Review of Systems  Constitutional:  Positive for weight loss. Negative for chills, fever and malaise/fatigue.  HENT:  Negative for hearing loss.   Eyes:  Negative for blurred vision and double vision.  Respiratory:  Negative for cough and shortness of  breath.   Cardiovascular:  Negative for chest pain, palpitations and leg swelling.  Gastrointestinal:  Negative for abdominal pain, blood in stool, constipation and diarrhea.  Genitourinary:  Negative for dysuria.  Skin:  Negative for rash.  Neurological:  Negative for dizziness, speech change, seizures, loss of consciousness and headaches.  Psychiatric/Behavioral:  Negative for depression.       Objective:     BP 120/70 (BP Location: Left Arm, Patient Position: Sitting, Cuff Size: Large)   Pulse (!) 105   Temp 97.7 F (36.5 C) (Oral)   Ht 6\' 4"  (1.93 m)   Wt 237 lb 12.8 oz (107.9 kg)   SpO2 99%   BMI 28.95 kg/m  {Vitals History (Optional):23777}  Physical Exam Constitutional:      General: He is not in acute distress.    Appearance: He is well-developed.  HENT:     Head: Normocephalic and atraumatic.     Right Ear: External ear normal.     Left Ear: External ear normal.  Eyes:     Conjunctiva/sclera: Conjunctivae normal.     Pupils: Pupils are equal, round, and reactive to light.  Neck:     Thyroid: No thyromegaly.  Cardiovascular:     Rate and Rhythm: Normal rate and regular rhythm.     Heart sounds: Normal heart sounds. No murmur heard. Pulmonary:     Effort: No respiratory distress.  Breath sounds: No wheezing or rales.  Abdominal:     General: Bowel sounds are normal. There is no distension.     Palpations: Abdomen is soft. There is no mass.     Tenderness: There is no abdominal tenderness. There is no guarding or rebound.  Musculoskeletal:     Cervical back: Normal range of motion and neck supple.  Lymphadenopathy:     Cervical: No cervical adenopathy.  Skin:    Findings: No rash.     Comments: Feet reveal no skin lesions. Good distal foot pulses. Good capillary refill. No calluses. Normal sensation with monofilament testing   Neurological:     Mental Status: He is alert and oriented to person, place, and time.     Cranial Nerves: No cranial nerve  deficit.     Deep Tendon Reflexes: Reflexes normal.      No results found for any visits on 09/01/22.  {Labs (Optional):23779}  The 10-year ASCVD risk score (Arnett DK, et al., 2019) is: 14.2%    Assessment & Plan:   Problem List Items Addressed This Visit       Unprioritized   Type 2 diabetes mellitus with hyperglycemia (HCC)   Relevant Orders   Hemoglobin A1c   Microalbumin / creatinine urine ratio   Hyperlipidemia   Relevant Orders   Lipid panel   Hepatic function panel   Other Visit Diagnoses     Physical exam    -  Primary   Relevant Orders   Basic metabolic panel   Lipid panel   Hepatic function panel   CBC with Differential/Platelet   Hemoglobin A1c   Microalbumin / creatinine urine ratio   PSA     History of chronic medical problems as above.  Out of medications for several months.  Suspect his blood sugar will likely be very poorly controlled.  Recheck labs as above.  We did discuss colon cancer screening with either Cologuard or colonoscopy but he declines at this time but will get back with [place] later.  Also check PSA for prostate cancer screening.  -Will likely need several medications refilled but wait on labs first. -Also discussed things like shingles vaccine but he declines because of cost  No follow-ups on file.    Evelena Peat, MD

## 2022-09-04 MED ORDER — ROSUVASTATIN CALCIUM 10 MG PO TABS: 10.0000 mg | ORAL_TABLET | Freq: Every day | ORAL | 0 refills | Status: DC

## 2022-09-04 MED ORDER — METFORMIN HCL 500 MG PO TABS: 500.0000 mg | ORAL_TABLET | Freq: Two times a day (BID) | ORAL | 0 refills | Status: DC

## 2022-09-04 NOTE — Addendum Note (Signed)
Addended by: Christy Sartorius on: 09/04/2022 11:47 AM   Modules accepted: Orders

## 2022-09-13 ENCOUNTER — Encounter: Payer: Self-pay | Admitting: Family Medicine

## 2022-12-29 ENCOUNTER — Other Ambulatory Visit: Payer: Self-pay | Admitting: Family Medicine

## 2023-01-19 ENCOUNTER — Telehealth: Payer: Self-pay | Admitting: Nurse Practitioner

## 2023-01-19 DIAGNOSIS — M545 Low back pain, unspecified: Secondary | ICD-10-CM

## 2023-01-19 NOTE — Progress Notes (Signed)
Peter Mcclure,  Thank you for submitting an e-visit.  Anytime a patient has numbness/ weakness or changes in sensation related to back pain we recommend an in person evaluation.   I feel your condition warrants further evaluation and I recommend that you be seen in a face to face visit.   NOTE: There will be NO CHARGE for this eVisit   If you are having a true medical emergency please call 911.      For an urgent face to face visit, [place] has eight urgent care centers for your convenience:   NEW!! [facility] Health Urgent Care Center at [facility] Get Driving Directions [phone] [address], [address] [place], [address]    [facility] Health Urgent Care Center at [facility] Get Driving Directions [phone] [address] [place], [place] [address]   [facility] Health Urgent Care Center [facility]) Get Driving Directions [phone] [address] [place], [place] [address]  [facility] Health Urgent Care Center [facility]) Get Driving Directions [phone] [address] [place],  [place]  [address]  [facility] Health Urgent Care Center [facility] - at [employer]  [phone] [address] [place],  [place] [address]   [facility] Health Urgent Care at [facility] Get Driving Directions [phone] [address] [place], [place] [address]   [facility] Health Urgent Care at [facility] Get Driving Directions  [phone] [address] [place], [place] [address]   [facility] Health Urgent Care at [facility] Directions [phone] [address] [place], [place] [address]  Your MyChart E-visit questionnaire answers were reviewed by a board certified advanced clinical practitioner to complete your personal care plan based on your specific symptoms.  Thank you for using e-Visits.

## 2023-01-22 ENCOUNTER — Ambulatory Visit: Payer: Self-pay | Admitting: Family Medicine

## 2023-01-30 ENCOUNTER — Ambulatory Visit: Payer: Self-pay | Admitting: Family Medicine

## 2023-01-30 ENCOUNTER — Other Ambulatory Visit: Payer: Self-pay | Admitting: Family Medicine

## 2023-04-27 ENCOUNTER — Other Ambulatory Visit: Payer: Self-pay | Admitting: Family Medicine

## 2023-06-18 ENCOUNTER — Ambulatory Visit: Payer: Self-pay | Admitting: Family Medicine

## 2023-06-22 ENCOUNTER — Ambulatory Visit: Payer: Self-pay | Admitting: Family Medicine

## 2023-07-22 ENCOUNTER — Other Ambulatory Visit: Payer: Self-pay | Admitting: Family Medicine

## 2023-08-01 ENCOUNTER — Ambulatory Visit (INDEPENDENT_AMBULATORY_CARE_PROVIDER_SITE_OTHER): Payer: Self-pay | Admitting: Family Medicine

## 2023-08-01 ENCOUNTER — Encounter: Payer: Self-pay | Admitting: Family Medicine

## 2023-08-01 ENCOUNTER — Ambulatory Visit: Payer: Self-pay

## 2023-08-01 VITALS — BP 162/90 | HR 113 | Temp 98.3°F | Wt 239.3 lb

## 2023-08-01 DIAGNOSIS — I10 Essential (primary) hypertension: Secondary | ICD-10-CM

## 2023-08-01 DIAGNOSIS — E1165 Type 2 diabetes mellitus with hyperglycemia: Secondary | ICD-10-CM

## 2023-08-01 DIAGNOSIS — K219 Gastro-esophageal reflux disease without esophagitis: Secondary | ICD-10-CM

## 2023-08-01 MED ORDER — LISINOPRIL 20 MG PO TABS: 20.0000 mg | ORAL_TABLET | Freq: Every day | ORAL | 5 refills | Status: AC

## 2023-08-01 MED ORDER — PANTOPRAZOLE SODIUM 40 MG PO TBEC: 40.0000 mg | DELAYED_RELEASE_TABLET | Freq: Every day | ORAL | 5 refills | Status: AC

## 2023-08-01 NOTE — Progress Notes (Signed)
 Established Patient Office Visit  Subjective   Patient ID: Peter Mcclure, male    DOB: 1970-08-20  Age: 53 y.o. MRN: [id number]  Chief Complaint  Patient presents with   Chest cramps   Leg Pain    Patient complains of left leg pain    HPI   Peter Mcclure has history of hypertension, GERD, type 2 diabetes, hyperlipidemia.  He had called recently with some increased heartburn symptoms.  He has past history of GERD.  Currently not taking any prescription medications.  Has recently tried some over-the-counter medication such as Pepcid without much relief.  Frequent burning sensation epigastric.  No dysphagia.  No appetite or weight changes.  He also had mention when he called down about having some chest pain.  On further questioning this is soreness to touch around the sternal region and worse with movement.  No exertional chest pain whatsoever.  He does have history of other chronic problems as above and basically not taking any regular medications at this point in terms of lisinopril  or diabetes medications.  He had elevated A1c when checked last June.  Overdue for follow-up.  Not monitoring blood sugars regularly.  Past Medical History:  Diagnosis Date   Diabetes mellitus without complication (HCC)    ED (erectile dysfunction)    GERD (gastroesophageal reflux disease)    Hepatic steatosis    Hypertension    Insomnia    Pulmonary nodules    History reviewed. No pertinent surgical history.  reports that he has never smoked. He has never used smokeless tobacco. He reports that he does not currently use alcohol after a past usage of about 4.0 standard drinks of alcohol per week. He reports that he does not use drugs. family history includes Diabetes in an other family member; Hypertension in an other family member. No Known Allergies  Review of Systems  Constitutional:  Negative for malaise/fatigue.  Eyes:  Negative for blurred vision.  Respiratory:  Negative for shortness of breath.    Cardiovascular:  Negative for leg swelling.  Gastrointestinal:  Positive for heartburn. Negative for blood in stool, melena and vomiting.       See HPI  Neurological:  Negative for dizziness, weakness and headaches.      Objective:     BP (!) 162/90 (BP Location: Left Arm, Cuff Size: Normal)   Pulse (!) 113   Temp 98.3 F (36.8 C) (Oral)   Wt 239 lb 4.8 oz (108.5 kg)   SpO2 97%   BMI 29.13 kg/m  BP Readings from Last 3 Encounters:  08/01/23 (!) 162/90  09/01/22 120/70  05/06/21 134/88   Wt Readings from Last 3 Encounters:  08/01/23 239 lb 4.8 oz (108.5 kg)  09/01/22 237 lb 12.8 oz (107.9 kg)  05/06/21 249 lb 6.4 oz (113.1 kg)      Physical Exam Vitals reviewed.  Constitutional:      Appearance: He is well-developed.  Eyes:     Pupils: Pupils are equal, round, and reactive to light.  Neck:     Thyroid : No thyromegaly.  Cardiovascular:     Rate and Rhythm: Normal rate and regular rhythm.  Pulmonary:     Effort: Pulmonary effort is normal. No respiratory distress.     Breath sounds: Normal breath sounds. No wheezing or rales.  Abdominal:     General: There is no distension.     Palpations: Abdomen is soft.     Tenderness: There is no abdominal tenderness. There is no guarding.  Musculoskeletal:  Cervical back: Neck supple.  Neurological:     Mental Status: He is alert and oriented to person, place, and time.      No results found for any visits on 08/01/23.  Last CBC Lab Results  Component Value Date   WBC 6.0 09/01/2022   HGB 14.8 09/01/2022   HCT 44.0 09/01/2022   MCV 97.8 09/01/2022   MCH 32.1 01/05/2020   RDW 11.8 09/01/2022   PLT 280.0 09/01/2022   Last metabolic panel Lab Results  Component Value Date   GLUCOSE 205 (H) 09/01/2022   NA 139 09/01/2022   K 4.0 09/01/2022   CL 102 09/01/2022   CO2 26 09/01/2022   BUN 9 09/01/2022   CREATININE 0.88 09/01/2022   GFR 99.42 09/01/2022   CALCIUM  9.5 09/01/2022   PROT 8.2 09/01/2022    ALBUMIN 4.7 09/01/2022   BILITOT 1.1 09/01/2022   ALKPHOS 59 09/01/2022   AST 18 09/01/2022   ALT 23 09/01/2022   ANIONGAP 10 01/05/2020   Last lipids Lab Results  Component Value Date   CHOL 233 (H) 09/01/2022   HDL 47.30 09/01/2022   LDLCALC 146 (H) 09/01/2022   LDLDIRECT 141.0 09/25/2017   TRIG 197.0 (H) 09/01/2022   CHOLHDL 5 09/01/2022   Last hemoglobin A1c Lab Results  Component Value Date   HGBA1C 8.1 (H) 09/01/2022   Last thyroid  functions Lab Results  Component Value Date   TSH 1.841 07/19/2019      The 10-year ASCVD risk score (Arnett DK, et al., 2019) is: 27.6%    Assessment & Plan:   #1 GERD.  Longstanding history of GERD.  Symptoms currently poorly controlled.  Start back Protonix  40 mg daily with prescription given.  He already had been on Pepcid without much relief.  Set up complete physical in a few weeks and reassess at that time.  #2 hypertension.  Currently off medications.  Blood pressure up today.  Refill lisinopril  and get back on that regularly.  Reassess in 2 to 3 weeks time and additional medication if indicated  #3 history of type 2 diabetes.  Currently off medications.  Needs follow-up physical and to get follow-up labs at that point.  Peter Lamy, MD

## 2023-08-01 NOTE — Patient Instructions (Signed)
 Set up physical for about 2-3 weeks from now.    Get back on the Lisinopril  and Protonix .

## 2023-08-01 NOTE — Telephone Encounter (Signed)
 Chief Complaint: Chest Pain Symptoms: "heartburn" Frequency: This AM Pertinent Negatives: Patient denies sweating, N/V, HA, vision changes, SOB Disposition: [] ED /[] Urgent Care (no appt availability in office) / [x] Appointment(In office/virtual)/ []  [facility]/ [] Home Care/ [] Refused Recommended Disposition /[] [facility]/ []  Follow-up with PCP Additional Notes: Pt reports he began experiencing upper abd/sternal discomfort this AM. Pt notes it feels like previously diagnosed heartburn but notes "cramping" in sternum is new. Denies SOB, sweating, HA, vision changes, N/V. OV scheduled. This RN educated pt on home care, new-worsening symptoms, when to call back/seek emergent care. Pt verbalized understanding and agrees to plan.    Copied from CRM [id number]. Topic: Clinical - Red Word Triage >> Aug 01, 2023  9:11 AM [place] E wrote: [facility] that prompted transfer to Nurse Triage: Patient stated he has been having a cramp/pain in his sternum/upper abdomen within the past 12 hours. Patient also stated his left leg has been feeling hot and cold for the past week, on and off episodes. Reason for Disposition  [1] Chest pain lasts < 5 minutes AND [2] NO chest pain or cardiac symptoms (e.g., breathing difficulty, sweating) now  (Exception: Chest pains that last only a few seconds.)  Answer Assessment - Initial Assessment Questions 1. LOCATION: "Where does it hurt?"       Mid chest/sternum 2. RADIATION: "Does the pain go anywhere else?" (e.g., into neck, jaw, arms, back)     None 3. ONSET: "When did the chest pain begin?" (Minutes, hours or days)      This AM 4. PATTERN: "Does the pain come and go, or has it been constant since it started?"  "Does it get worse with exertion?"      Constant since it began 5. DURATION: "How long does it last" (e.g., seconds, minutes, hours)     Ongoing 6. SEVERITY: "How bad is the pain?"  (e.g., Scale 1-10; mild, moderate, or severe)    - MILD  (1-3): doesn't interfere with normal activities     - MODERATE (4-7): interferes with normal activities or awakens from sleep    - SEVERE (8-10): excruciating pain, unable to do any normal activities       Discomfort/more like heartburn 7. CARDIAC RISK FACTORS: "Do you have any history of heart problems or risk factors for heart disease?" (e.g., angina, prior heart attack; diabetes, high blood pressure, high cholesterol, smoker, or strong family history of heart disease)     Diabetes, HTN 8. PULMONARY RISK FACTORS: "Do you have any history of lung disease?"  (e.g., blood clots in lung, asthma, emphysema, birth control pills)     None 9. CAUSE: "What do you think is causing the chest pain?"     Unknown 10. OTHER SYMPTOMS: "Do you have any other symptoms?" (e.g., dizziness, nausea, vomiting, sweating, fever, difficulty breathing, cough)       None  Protocols used: Chest Pain-A-AH

## 2023-08-22 ENCOUNTER — Ambulatory Visit: Payer: Self-pay | Admitting: Family Medicine

## 2023-08-29 ENCOUNTER — Ambulatory Visit: Payer: Self-pay | Admitting: Family Medicine

## 2023-09-04 ENCOUNTER — Encounter: Payer: Self-pay | Admitting: Family Medicine

## 2023-09-04 ENCOUNTER — Other Ambulatory Visit: Payer: Self-pay | Admitting: Family Medicine

## 2023-09-12 ENCOUNTER — Encounter: Payer: Self-pay | Admitting: Family Medicine

## 2023-10-01 ENCOUNTER — Encounter: Payer: Self-pay | Admitting: Family Medicine

## 2023-10-08 ENCOUNTER — Other Ambulatory Visit: Payer: Self-pay | Admitting: Family Medicine

## 2023-10-22 ENCOUNTER — Other Ambulatory Visit: Payer: Self-pay | Admitting: Family Medicine

## 2023-11-18 ENCOUNTER — Other Ambulatory Visit: Payer: Self-pay | Admitting: Family Medicine

## 2023-11-28 ENCOUNTER — Other Ambulatory Visit: Payer: Self-pay | Admitting: Family Medicine

## 2023-12-07 ENCOUNTER — Encounter: Payer: Self-pay | Admitting: Family Medicine

## 2023-12-07 MED ORDER — SILDENAFIL CITRATE 100 MG PO TABS: ORAL_TABLET | ORAL | 1 refills | Status: AC

## 2024-01-01 ENCOUNTER — Encounter: Payer: Self-pay | Admitting: Family Medicine

## 2024-01-04 ENCOUNTER — Encounter: Payer: Self-pay | Admitting: Family Medicine

## 2024-01-08 ENCOUNTER — Telehealth: Payer: Self-pay | Admitting: Family Medicine

## 2024-01-08 ENCOUNTER — Encounter: Payer: Self-pay | Admitting: Family Medicine

## 2024-01-08 NOTE — Telephone Encounter (Signed)
 Patient has late arrived/no showed 4 appointments since 04/02/23 which qualifies for dismissal. Please advise, thanks!

## 2024-01-09 ENCOUNTER — Telehealth: Payer: Self-pay | Admitting: Family Medicine

## 2024-01-09 ENCOUNTER — Encounter: Payer: Self-pay | Admitting: Family Medicine

## 2024-01-09 NOTE — Telephone Encounter (Signed)
 Noted

## 2024-01-09 NOTE — Telephone Encounter (Signed)
 Patient has late cancelled/no showed 5 appointments since 04/02/23 which qualifies for dismissal. Please advise, thanks

## 2024-01-09 NOTE — Telephone Encounter (Signed)
 Pls disregard, patient already dismissed.

## 2024-01-11 ENCOUNTER — Encounter: Payer: Self-pay | Admitting: Family Medicine

## 2024-03-02 ENCOUNTER — Other Ambulatory Visit: Payer: Self-pay | Admitting: Family Medicine

## 2024-03-15 ENCOUNTER — Other Ambulatory Visit: Payer: Self-pay | Admitting: Family Medicine

## 2024-03-19 ENCOUNTER — Other Ambulatory Visit: Payer: Self-pay | Admitting: Family Medicine
